# Patient Record
Sex: Male | Born: 1950 | Race: White | Hispanic: No | Marital: Married | Smoking: Never smoker
Health system: Southern US, Community
[De-identification: ages and names within clinical notes are randomized; demographics above are authoritative.]

## PROBLEM LIST (undated history)

## (undated) DIAGNOSIS — N200 Calculus of kidney: Secondary | ICD-10-CM

## (undated) DIAGNOSIS — C169 Malignant neoplasm of stomach, unspecified: Secondary | ICD-10-CM

## (undated) DIAGNOSIS — E119 Type 2 diabetes mellitus without complications: Secondary | ICD-10-CM

## (undated) HISTORY — PX: ABDOMINAL SURGERY: SHX537

## (undated) HISTORY — PX: KNEE SURGERY: SHX244

---

## 2018-01-22 DIAGNOSIS — E785 Hyperlipidemia, unspecified: Secondary | ICD-10-CM | POA: Diagnosis not present

## 2018-01-22 DIAGNOSIS — I10 Essential (primary) hypertension: Secondary | ICD-10-CM | POA: Diagnosis not present

## 2018-01-22 DIAGNOSIS — E669 Obesity, unspecified: Secondary | ICD-10-CM | POA: Diagnosis not present

## 2018-01-22 DIAGNOSIS — R0600 Dyspnea, unspecified: Secondary | ICD-10-CM | POA: Diagnosis not present

## 2018-01-22 DIAGNOSIS — E119 Type 2 diabetes mellitus without complications: Secondary | ICD-10-CM | POA: Diagnosis not present

## 2018-01-22 DIAGNOSIS — C8590 Non-Hodgkin lymphoma, unspecified, unspecified site: Secondary | ICD-10-CM | POA: Diagnosis not present

## 2018-01-22 DIAGNOSIS — Z6833 Body mass index (BMI) 33.0-33.9, adult: Secondary | ICD-10-CM | POA: Diagnosis not present

## 2018-05-06 DIAGNOSIS — Z6834 Body mass index (BMI) 34.0-34.9, adult: Secondary | ICD-10-CM | POA: Diagnosis not present

## 2018-05-06 DIAGNOSIS — C8590 Non-Hodgkin lymphoma, unspecified, unspecified site: Secondary | ICD-10-CM | POA: Diagnosis not present

## 2018-05-06 DIAGNOSIS — E785 Hyperlipidemia, unspecified: Secondary | ICD-10-CM | POA: Diagnosis not present

## 2018-05-06 DIAGNOSIS — Z9181 History of falling: Secondary | ICD-10-CM | POA: Diagnosis not present

## 2018-05-06 DIAGNOSIS — E119 Type 2 diabetes mellitus without complications: Secondary | ICD-10-CM | POA: Diagnosis not present

## 2018-05-06 DIAGNOSIS — R0600 Dyspnea, unspecified: Secondary | ICD-10-CM | POA: Diagnosis not present

## 2018-05-06 DIAGNOSIS — I10 Essential (primary) hypertension: Secondary | ICD-10-CM | POA: Diagnosis not present

## 2018-05-06 DIAGNOSIS — E669 Obesity, unspecified: Secondary | ICD-10-CM | POA: Diagnosis not present

## 2018-05-21 DIAGNOSIS — E669 Obesity, unspecified: Secondary | ICD-10-CM | POA: Diagnosis not present

## 2018-05-21 DIAGNOSIS — I10 Essential (primary) hypertension: Secondary | ICD-10-CM | POA: Diagnosis not present

## 2018-05-21 DIAGNOSIS — C8590 Non-Hodgkin lymphoma, unspecified, unspecified site: Secondary | ICD-10-CM | POA: Diagnosis not present

## 2018-05-21 DIAGNOSIS — E785 Hyperlipidemia, unspecified: Secondary | ICD-10-CM | POA: Diagnosis not present

## 2018-05-21 DIAGNOSIS — Z1339 Encounter for screening examination for other mental health and behavioral disorders: Secondary | ICD-10-CM | POA: Diagnosis not present

## 2018-05-21 DIAGNOSIS — Z6834 Body mass index (BMI) 34.0-34.9, adult: Secondary | ICD-10-CM | POA: Diagnosis not present

## 2018-05-21 DIAGNOSIS — R0600 Dyspnea, unspecified: Secondary | ICD-10-CM | POA: Diagnosis not present

## 2018-05-21 DIAGNOSIS — E119 Type 2 diabetes mellitus without complications: Secondary | ICD-10-CM | POA: Diagnosis not present

## 2018-06-25 DIAGNOSIS — C8330 Diffuse large B-cell lymphoma, unspecified site: Secondary | ICD-10-CM | POA: Diagnosis not present

## 2018-06-25 DIAGNOSIS — I10 Essential (primary) hypertension: Secondary | ICD-10-CM | POA: Diagnosis not present

## 2018-06-25 DIAGNOSIS — E119 Type 2 diabetes mellitus without complications: Secondary | ICD-10-CM | POA: Diagnosis not present

## 2018-06-25 DIAGNOSIS — N2889 Other specified disorders of kidney and ureter: Secondary | ICD-10-CM | POA: Diagnosis not present

## 2018-07-28 DIAGNOSIS — E113299 Type 2 diabetes mellitus with mild nonproliferative diabetic retinopathy without macular edema, unspecified eye: Secondary | ICD-10-CM | POA: Diagnosis not present

## 2018-07-28 DIAGNOSIS — H25813 Combined forms of age-related cataract, bilateral: Secondary | ICD-10-CM | POA: Diagnosis not present

## 2018-07-28 DIAGNOSIS — H40013 Open angle with borderline findings, low risk, bilateral: Secondary | ICD-10-CM | POA: Diagnosis not present

## 2018-07-29 DIAGNOSIS — Z6834 Body mass index (BMI) 34.0-34.9, adult: Secondary | ICD-10-CM | POA: Diagnosis not present

## 2018-07-29 DIAGNOSIS — E669 Obesity, unspecified: Secondary | ICD-10-CM | POA: Diagnosis not present

## 2018-07-29 DIAGNOSIS — E785 Hyperlipidemia, unspecified: Secondary | ICD-10-CM | POA: Diagnosis not present

## 2018-07-29 DIAGNOSIS — Z0000 Encounter for general adult medical examination without abnormal findings: Secondary | ICD-10-CM | POA: Diagnosis not present

## 2018-07-29 DIAGNOSIS — Z125 Encounter for screening for malignant neoplasm of prostate: Secondary | ICD-10-CM | POA: Diagnosis not present

## 2018-07-29 DIAGNOSIS — Z9181 History of falling: Secondary | ICD-10-CM | POA: Diagnosis not present

## 2018-07-29 DIAGNOSIS — Z1331 Encounter for screening for depression: Secondary | ICD-10-CM | POA: Diagnosis not present

## 2018-07-29 DIAGNOSIS — Z139 Encounter for screening, unspecified: Secondary | ICD-10-CM | POA: Diagnosis not present

## 2018-08-14 ENCOUNTER — Other Ambulatory Visit: Payer: Self-pay

## 2018-08-14 NOTE — Patient Outreach (Signed)
[name]) Care Management  08/14/2018  Brad Spencer 1951-03-25 [id number]   Telephone Screen  Referral Date: 08/14/18 Referral Source: [employer] Concierge-Urgent Referral Reason: " member is on insulin and in the coverage gap" Insurance: [employer]   Outreach attempt # 1 to patient. Spoke with patient and discussed referral with patient. He voices that his son committed suicide in his home and he was the one that found him in January of this year. He states that the three months or so that followed were very difficult times for him and his spouse. As a result, he became depressed and neglected his health. His diabetes became uncontrolled during this time.. As he was not adhering to diet restrictions. Patient states that A1C go up to 8.9. After he recovered from his son's death he got back focused on his personal health. He states that now is DM is very well controlled. He doesn't ned to take as much diabetic med as he was previously taking. Patient voices cbgs ranging in the 100s-115's whenever he checks it throughout the day. He has contacted PCP to discuss diabetes mgmt with him. He voices that he has an appt on Oct. 4 with PCP-[name] at [facility]. Patient voices that since his cbgs have been good he has decided to cut back on the amount of insulin he is taking and advised that MD is aware of this. He reports that he is in the donut hole. However, he is not looking for assistance at this time. He was wanting [facility] to change insulin for him. Advised patient that this was something that he needed to discuss with treating physician. His goal is to talk with MD to see if he can totally stop insulin and/or change insulin to the $25 insulin vial that is available at [facility]. He does not feel like he needs pharmacy and/or med assistance until he speaks with PCP and come up with a med regimen for him. He denies any other RN CM needs or concerns at this time. Screening assessment completed  with patient and no other needs/concerns identified. Advised patient that RN CM would send out [facility] brochure for future reference and to call for any needs or concerns. He voiced understanding.     Plan: RN CM will close case at this time.   [name], RN,BSN,CCM [facility] Telephonic Care Management Coordinator Direct Phone: [phone] Toll Free: [phone] Fax: [phone]

## 2018-09-04 DIAGNOSIS — E785 Hyperlipidemia, unspecified: Secondary | ICD-10-CM | POA: Diagnosis not present

## 2018-09-04 DIAGNOSIS — I10 Essential (primary) hypertension: Secondary | ICD-10-CM | POA: Diagnosis not present

## 2018-09-04 DIAGNOSIS — E118 Type 2 diabetes mellitus with unspecified complications: Secondary | ICD-10-CM | POA: Diagnosis not present

## 2018-09-04 DIAGNOSIS — C8590 Non-Hodgkin lymphoma, unspecified, unspecified site: Secondary | ICD-10-CM | POA: Diagnosis not present

## 2018-09-04 DIAGNOSIS — R0600 Dyspnea, unspecified: Secondary | ICD-10-CM | POA: Diagnosis not present

## 2018-09-04 DIAGNOSIS — Z23 Encounter for immunization: Secondary | ICD-10-CM | POA: Diagnosis not present

## 2018-09-04 DIAGNOSIS — E669 Obesity, unspecified: Secondary | ICD-10-CM | POA: Diagnosis not present

## 2018-09-04 DIAGNOSIS — Z6834 Body mass index (BMI) 34.0-34.9, adult: Secondary | ICD-10-CM | POA: Diagnosis not present

## 2018-10-27 DIAGNOSIS — L570 Actinic keratosis: Secondary | ICD-10-CM | POA: Diagnosis not present

## 2018-10-27 DIAGNOSIS — C44319 Basal cell carcinoma of skin of other parts of face: Secondary | ICD-10-CM | POA: Diagnosis not present

## 2018-12-29 DIAGNOSIS — I10 Essential (primary) hypertension: Secondary | ICD-10-CM | POA: Diagnosis not present

## 2018-12-29 DIAGNOSIS — E669 Obesity, unspecified: Secondary | ICD-10-CM | POA: Diagnosis not present

## 2018-12-29 DIAGNOSIS — Z6834 Body mass index (BMI) 34.0-34.9, adult: Secondary | ICD-10-CM | POA: Diagnosis not present

## 2018-12-29 DIAGNOSIS — E118 Type 2 diabetes mellitus with unspecified complications: Secondary | ICD-10-CM | POA: Diagnosis not present

## 2018-12-29 DIAGNOSIS — E785 Hyperlipidemia, unspecified: Secondary | ICD-10-CM | POA: Diagnosis not present

## 2018-12-29 DIAGNOSIS — C8590 Non-Hodgkin lymphoma, unspecified, unspecified site: Secondary | ICD-10-CM | POA: Diagnosis not present

## 2018-12-29 DIAGNOSIS — R0600 Dyspnea, unspecified: Secondary | ICD-10-CM | POA: Diagnosis not present

## 2019-01-26 DIAGNOSIS — I10 Essential (primary) hypertension: Secondary | ICD-10-CM | POA: Diagnosis not present

## 2019-01-26 DIAGNOSIS — C8590 Non-Hodgkin lymphoma, unspecified, unspecified site: Secondary | ICD-10-CM | POA: Diagnosis not present

## 2019-01-26 DIAGNOSIS — R0600 Dyspnea, unspecified: Secondary | ICD-10-CM | POA: Diagnosis not present

## 2019-01-26 DIAGNOSIS — E118 Type 2 diabetes mellitus with unspecified complications: Secondary | ICD-10-CM | POA: Diagnosis not present

## 2019-01-26 DIAGNOSIS — E669 Obesity, unspecified: Secondary | ICD-10-CM | POA: Diagnosis not present

## 2019-01-26 DIAGNOSIS — E785 Hyperlipidemia, unspecified: Secondary | ICD-10-CM | POA: Diagnosis not present

## 2019-01-26 DIAGNOSIS — Z6834 Body mass index (BMI) 34.0-34.9, adult: Secondary | ICD-10-CM | POA: Diagnosis not present

## 2019-03-01 DIAGNOSIS — E785 Hyperlipidemia, unspecified: Secondary | ICD-10-CM | POA: Diagnosis not present

## 2019-03-01 DIAGNOSIS — C8590 Non-Hodgkin lymphoma, unspecified, unspecified site: Secondary | ICD-10-CM | POA: Diagnosis not present

## 2019-03-01 DIAGNOSIS — R0600 Dyspnea, unspecified: Secondary | ICD-10-CM | POA: Diagnosis not present

## 2019-03-01 DIAGNOSIS — E669 Obesity, unspecified: Secondary | ICD-10-CM | POA: Diagnosis not present

## 2019-03-01 DIAGNOSIS — I10 Essential (primary) hypertension: Secondary | ICD-10-CM | POA: Diagnosis not present

## 2019-03-01 DIAGNOSIS — Z6834 Body mass index (BMI) 34.0-34.9, adult: Secondary | ICD-10-CM | POA: Diagnosis not present

## 2019-03-01 DIAGNOSIS — E118 Type 2 diabetes mellitus with unspecified complications: Secondary | ICD-10-CM | POA: Diagnosis not present

## 2019-04-02 DIAGNOSIS — I10 Essential (primary) hypertension: Secondary | ICD-10-CM | POA: Diagnosis not present

## 2019-04-02 DIAGNOSIS — R0600 Dyspnea, unspecified: Secondary | ICD-10-CM | POA: Diagnosis not present

## 2019-04-02 DIAGNOSIS — E118 Type 2 diabetes mellitus with unspecified complications: Secondary | ICD-10-CM | POA: Diagnosis not present

## 2019-04-02 DIAGNOSIS — E669 Obesity, unspecified: Secondary | ICD-10-CM | POA: Diagnosis not present

## 2019-04-02 DIAGNOSIS — E785 Hyperlipidemia, unspecified: Secondary | ICD-10-CM | POA: Diagnosis not present

## 2019-04-02 DIAGNOSIS — C8590 Non-Hodgkin lymphoma, unspecified, unspecified site: Secondary | ICD-10-CM | POA: Diagnosis not present

## 2019-04-02 DIAGNOSIS — Z6834 Body mass index (BMI) 34.0-34.9, adult: Secondary | ICD-10-CM | POA: Diagnosis not present

## 2019-04-16 DIAGNOSIS — I10 Essential (primary) hypertension: Secondary | ICD-10-CM | POA: Diagnosis not present

## 2019-04-16 DIAGNOSIS — C8590 Non-Hodgkin lymphoma, unspecified, unspecified site: Secondary | ICD-10-CM | POA: Diagnosis not present

## 2019-04-16 DIAGNOSIS — Z6834 Body mass index (BMI) 34.0-34.9, adult: Secondary | ICD-10-CM | POA: Diagnosis not present

## 2019-04-16 DIAGNOSIS — R0600 Dyspnea, unspecified: Secondary | ICD-10-CM | POA: Diagnosis not present

## 2019-04-16 DIAGNOSIS — K5909 Other constipation: Secondary | ICD-10-CM | POA: Diagnosis not present

## 2019-04-16 DIAGNOSIS — E118 Type 2 diabetes mellitus with unspecified complications: Secondary | ICD-10-CM | POA: Diagnosis not present

## 2019-04-16 DIAGNOSIS — E785 Hyperlipidemia, unspecified: Secondary | ICD-10-CM | POA: Diagnosis not present

## 2019-04-16 DIAGNOSIS — E669 Obesity, unspecified: Secondary | ICD-10-CM | POA: Diagnosis not present

## 2019-05-07 DIAGNOSIS — E118 Type 2 diabetes mellitus with unspecified complications: Secondary | ICD-10-CM | POA: Diagnosis not present

## 2019-05-07 DIAGNOSIS — R0600 Dyspnea, unspecified: Secondary | ICD-10-CM | POA: Diagnosis not present

## 2019-05-07 DIAGNOSIS — K5909 Other constipation: Secondary | ICD-10-CM | POA: Diagnosis not present

## 2019-05-07 DIAGNOSIS — Z6835 Body mass index (BMI) 35.0-35.9, adult: Secondary | ICD-10-CM | POA: Diagnosis not present

## 2019-05-07 DIAGNOSIS — C8590 Non-Hodgkin lymphoma, unspecified, unspecified site: Secondary | ICD-10-CM | POA: Diagnosis not present

## 2019-05-07 DIAGNOSIS — E785 Hyperlipidemia, unspecified: Secondary | ICD-10-CM | POA: Diagnosis not present

## 2019-05-07 DIAGNOSIS — I10 Essential (primary) hypertension: Secondary | ICD-10-CM | POA: Diagnosis not present

## 2019-05-07 DIAGNOSIS — E669 Obesity, unspecified: Secondary | ICD-10-CM | POA: Diagnosis not present

## 2019-06-19 DIAGNOSIS — S93402A Sprain of unspecified ligament of left ankle, initial encounter: Secondary | ICD-10-CM | POA: Diagnosis not present

## 2019-07-08 DIAGNOSIS — E669 Obesity, unspecified: Secondary | ICD-10-CM | POA: Diagnosis not present

## 2019-07-08 DIAGNOSIS — I10 Essential (primary) hypertension: Secondary | ICD-10-CM | POA: Diagnosis not present

## 2019-07-08 DIAGNOSIS — C8590 Non-Hodgkin lymphoma, unspecified, unspecified site: Secondary | ICD-10-CM | POA: Diagnosis not present

## 2019-07-08 DIAGNOSIS — E785 Hyperlipidemia, unspecified: Secondary | ICD-10-CM | POA: Diagnosis not present

## 2019-07-08 DIAGNOSIS — K5909 Other constipation: Secondary | ICD-10-CM | POA: Diagnosis not present

## 2019-07-08 DIAGNOSIS — M159 Polyosteoarthritis, unspecified: Secondary | ICD-10-CM | POA: Diagnosis not present

## 2019-07-08 DIAGNOSIS — Z6834 Body mass index (BMI) 34.0-34.9, adult: Secondary | ICD-10-CM | POA: Diagnosis not present

## 2019-07-08 DIAGNOSIS — R0600 Dyspnea, unspecified: Secondary | ICD-10-CM | POA: Diagnosis not present

## 2019-07-08 DIAGNOSIS — E118 Type 2 diabetes mellitus with unspecified complications: Secondary | ICD-10-CM | POA: Diagnosis not present

## 2019-07-20 DIAGNOSIS — E119 Type 2 diabetes mellitus without complications: Secondary | ICD-10-CM | POA: Diagnosis not present

## 2019-07-20 DIAGNOSIS — R5383 Other fatigue: Secondary | ICD-10-CM | POA: Diagnosis not present

## 2019-07-20 DIAGNOSIS — N2889 Other specified disorders of kidney and ureter: Secondary | ICD-10-CM | POA: Diagnosis not present

## 2019-07-20 DIAGNOSIS — C8330 Diffuse large B-cell lymphoma, unspecified site: Secondary | ICD-10-CM | POA: Diagnosis not present

## 2019-07-20 DIAGNOSIS — I10 Essential (primary) hypertension: Secondary | ICD-10-CM | POA: Diagnosis not present

## 2019-08-04 DIAGNOSIS — Z1331 Encounter for screening for depression: Secondary | ICD-10-CM | POA: Diagnosis not present

## 2019-08-04 DIAGNOSIS — Z0000 Encounter for general adult medical examination without abnormal findings: Secondary | ICD-10-CM | POA: Diagnosis not present

## 2019-08-04 DIAGNOSIS — Z139 Encounter for screening, unspecified: Secondary | ICD-10-CM | POA: Diagnosis not present

## 2019-08-04 DIAGNOSIS — Z125 Encounter for screening for malignant neoplasm of prostate: Secondary | ICD-10-CM | POA: Diagnosis not present

## 2019-08-04 DIAGNOSIS — Z9181 History of falling: Secondary | ICD-10-CM | POA: Diagnosis not present

## 2019-08-04 DIAGNOSIS — Z6834 Body mass index (BMI) 34.0-34.9, adult: Secondary | ICD-10-CM | POA: Diagnosis not present

## 2019-08-04 DIAGNOSIS — E785 Hyperlipidemia, unspecified: Secondary | ICD-10-CM | POA: Diagnosis not present

## 2019-08-20 DIAGNOSIS — D126 Benign neoplasm of colon, unspecified: Secondary | ICD-10-CM | POA: Diagnosis not present

## 2019-08-20 DIAGNOSIS — K635 Polyp of colon: Secondary | ICD-10-CM | POA: Diagnosis not present

## 2019-08-20 DIAGNOSIS — Z1211 Encounter for screening for malignant neoplasm of colon: Secondary | ICD-10-CM | POA: Diagnosis not present

## 2019-09-28 DIAGNOSIS — E785 Hyperlipidemia, unspecified: Secondary | ICD-10-CM | POA: Diagnosis not present

## 2019-09-28 DIAGNOSIS — R0600 Dyspnea, unspecified: Secondary | ICD-10-CM | POA: Diagnosis not present

## 2019-09-28 DIAGNOSIS — Z6834 Body mass index (BMI) 34.0-34.9, adult: Secondary | ICD-10-CM | POA: Diagnosis not present

## 2019-09-28 DIAGNOSIS — I10 Essential (primary) hypertension: Secondary | ICD-10-CM | POA: Diagnosis not present

## 2019-09-28 DIAGNOSIS — E669 Obesity, unspecified: Secondary | ICD-10-CM | POA: Diagnosis not present

## 2019-09-28 DIAGNOSIS — M159 Polyosteoarthritis, unspecified: Secondary | ICD-10-CM | POA: Diagnosis not present

## 2019-09-28 DIAGNOSIS — C8590 Non-Hodgkin lymphoma, unspecified, unspecified site: Secondary | ICD-10-CM | POA: Diagnosis not present

## 2019-09-28 DIAGNOSIS — K5909 Other constipation: Secondary | ICD-10-CM | POA: Diagnosis not present

## 2019-10-12 DIAGNOSIS — K5909 Other constipation: Secondary | ICD-10-CM | POA: Diagnosis not present

## 2019-10-12 DIAGNOSIS — Z6834 Body mass index (BMI) 34.0-34.9, adult: Secondary | ICD-10-CM | POA: Diagnosis not present

## 2019-10-12 DIAGNOSIS — M159 Polyosteoarthritis, unspecified: Secondary | ICD-10-CM | POA: Diagnosis not present

## 2019-10-12 DIAGNOSIS — R0600 Dyspnea, unspecified: Secondary | ICD-10-CM | POA: Diagnosis not present

## 2019-10-12 DIAGNOSIS — Z23 Encounter for immunization: Secondary | ICD-10-CM | POA: Diagnosis not present

## 2019-10-12 DIAGNOSIS — M25512 Pain in left shoulder: Secondary | ICD-10-CM | POA: Diagnosis not present

## 2019-10-12 DIAGNOSIS — C8590 Non-Hodgkin lymphoma, unspecified, unspecified site: Secondary | ICD-10-CM | POA: Diagnosis not present

## 2019-10-12 DIAGNOSIS — E785 Hyperlipidemia, unspecified: Secondary | ICD-10-CM | POA: Diagnosis not present

## 2019-10-12 DIAGNOSIS — I10 Essential (primary) hypertension: Secondary | ICD-10-CM | POA: Diagnosis not present

## 2019-10-12 DIAGNOSIS — E118 Type 2 diabetes mellitus with unspecified complications: Secondary | ICD-10-CM | POA: Diagnosis not present

## 2019-10-12 DIAGNOSIS — E669 Obesity, unspecified: Secondary | ICD-10-CM | POA: Diagnosis not present

## 2019-10-26 DIAGNOSIS — E118 Type 2 diabetes mellitus with unspecified complications: Secondary | ICD-10-CM | POA: Diagnosis not present

## 2019-10-26 DIAGNOSIS — C8590 Non-Hodgkin lymphoma, unspecified, unspecified site: Secondary | ICD-10-CM | POA: Diagnosis not present

## 2019-10-26 DIAGNOSIS — E785 Hyperlipidemia, unspecified: Secondary | ICD-10-CM | POA: Diagnosis not present

## 2019-10-26 DIAGNOSIS — K5909 Other constipation: Secondary | ICD-10-CM | POA: Diagnosis not present

## 2019-10-26 DIAGNOSIS — Z6834 Body mass index (BMI) 34.0-34.9, adult: Secondary | ICD-10-CM | POA: Diagnosis not present

## 2019-10-26 DIAGNOSIS — I10 Essential (primary) hypertension: Secondary | ICD-10-CM | POA: Diagnosis not present

## 2019-10-26 DIAGNOSIS — E669 Obesity, unspecified: Secondary | ICD-10-CM | POA: Diagnosis not present

## 2019-10-26 DIAGNOSIS — M159 Polyosteoarthritis, unspecified: Secondary | ICD-10-CM | POA: Diagnosis not present

## 2019-10-26 DIAGNOSIS — R0600 Dyspnea, unspecified: Secondary | ICD-10-CM | POA: Diagnosis not present

## 2019-11-02 DIAGNOSIS — L570 Actinic keratosis: Secondary | ICD-10-CM | POA: Diagnosis not present

## 2019-11-23 DIAGNOSIS — E785 Hyperlipidemia, unspecified: Secondary | ICD-10-CM | POA: Diagnosis not present

## 2019-11-23 DIAGNOSIS — K5909 Other constipation: Secondary | ICD-10-CM | POA: Diagnosis not present

## 2019-11-23 DIAGNOSIS — E118 Type 2 diabetes mellitus with unspecified complications: Secondary | ICD-10-CM | POA: Diagnosis not present

## 2019-11-23 DIAGNOSIS — E669 Obesity, unspecified: Secondary | ICD-10-CM | POA: Diagnosis not present

## 2019-11-23 DIAGNOSIS — Z6834 Body mass index (BMI) 34.0-34.9, adult: Secondary | ICD-10-CM | POA: Diagnosis not present

## 2019-11-23 DIAGNOSIS — C8590 Non-Hodgkin lymphoma, unspecified, unspecified site: Secondary | ICD-10-CM | POA: Diagnosis not present

## 2019-11-23 DIAGNOSIS — M159 Polyosteoarthritis, unspecified: Secondary | ICD-10-CM | POA: Diagnosis not present

## 2019-11-23 DIAGNOSIS — I10 Essential (primary) hypertension: Secondary | ICD-10-CM | POA: Diagnosis not present

## 2019-11-23 DIAGNOSIS — R0600 Dyspnea, unspecified: Secondary | ICD-10-CM | POA: Diagnosis not present

## 2019-12-14 DIAGNOSIS — J208 Acute bronchitis due to other specified organisms: Secondary | ICD-10-CM | POA: Diagnosis not present

## 2019-12-14 DIAGNOSIS — K5909 Other constipation: Secondary | ICD-10-CM | POA: Diagnosis not present

## 2019-12-14 DIAGNOSIS — E118 Type 2 diabetes mellitus with unspecified complications: Secondary | ICD-10-CM | POA: Diagnosis not present

## 2019-12-14 DIAGNOSIS — Z6834 Body mass index (BMI) 34.0-34.9, adult: Secondary | ICD-10-CM | POA: Diagnosis not present

## 2019-12-14 DIAGNOSIS — M159 Polyosteoarthritis, unspecified: Secondary | ICD-10-CM | POA: Diagnosis not present

## 2019-12-14 DIAGNOSIS — C8590 Non-Hodgkin lymphoma, unspecified, unspecified site: Secondary | ICD-10-CM | POA: Diagnosis not present

## 2019-12-14 DIAGNOSIS — I10 Essential (primary) hypertension: Secondary | ICD-10-CM | POA: Diagnosis not present

## 2019-12-14 DIAGNOSIS — B9689 Other specified bacterial agents as the cause of diseases classified elsewhere: Secondary | ICD-10-CM | POA: Diagnosis not present

## 2019-12-14 DIAGNOSIS — E785 Hyperlipidemia, unspecified: Secondary | ICD-10-CM | POA: Diagnosis not present

## 2019-12-14 DIAGNOSIS — R0600 Dyspnea, unspecified: Secondary | ICD-10-CM | POA: Diagnosis not present

## 2019-12-14 DIAGNOSIS — E669 Obesity, unspecified: Secondary | ICD-10-CM | POA: Diagnosis not present

## 2019-12-15 DIAGNOSIS — E785 Hyperlipidemia, unspecified: Secondary | ICD-10-CM | POA: Diagnosis not present

## 2019-12-15 DIAGNOSIS — E118 Type 2 diabetes mellitus with unspecified complications: Secondary | ICD-10-CM | POA: Diagnosis not present

## 2019-12-15 DIAGNOSIS — R0600 Dyspnea, unspecified: Secondary | ICD-10-CM | POA: Diagnosis not present

## 2019-12-15 DIAGNOSIS — Z20828 Contact with and (suspected) exposure to other viral communicable diseases: Secondary | ICD-10-CM | POA: Diagnosis not present

## 2019-12-15 DIAGNOSIS — R6889 Other general symptoms and signs: Secondary | ICD-10-CM | POA: Diagnosis not present

## 2019-12-15 DIAGNOSIS — Z20822 Contact with and (suspected) exposure to covid-19: Secondary | ICD-10-CM | POA: Diagnosis not present

## 2019-12-15 DIAGNOSIS — I10 Essential (primary) hypertension: Secondary | ICD-10-CM | POA: Diagnosis not present

## 2019-12-15 DIAGNOSIS — J208 Acute bronchitis due to other specified organisms: Secondary | ICD-10-CM | POA: Diagnosis not present

## 2019-12-15 DIAGNOSIS — M159 Polyosteoarthritis, unspecified: Secondary | ICD-10-CM | POA: Diagnosis not present

## 2019-12-15 DIAGNOSIS — K5909 Other constipation: Secondary | ICD-10-CM | POA: Diagnosis not present

## 2019-12-15 DIAGNOSIS — C8590 Non-Hodgkin lymphoma, unspecified, unspecified site: Secondary | ICD-10-CM | POA: Diagnosis not present

## 2019-12-15 DIAGNOSIS — E669 Obesity, unspecified: Secondary | ICD-10-CM | POA: Diagnosis not present

## 2019-12-15 DIAGNOSIS — B9689 Other specified bacterial agents as the cause of diseases classified elsewhere: Secondary | ICD-10-CM | POA: Diagnosis not present

## 2019-12-17 DIAGNOSIS — C8590 Non-Hodgkin lymphoma, unspecified, unspecified site: Secondary | ICD-10-CM | POA: Diagnosis not present

## 2019-12-17 DIAGNOSIS — Z6834 Body mass index (BMI) 34.0-34.9, adult: Secondary | ICD-10-CM | POA: Diagnosis not present

## 2019-12-17 DIAGNOSIS — E785 Hyperlipidemia, unspecified: Secondary | ICD-10-CM | POA: Diagnosis not present

## 2019-12-17 DIAGNOSIS — I10 Essential (primary) hypertension: Secondary | ICD-10-CM | POA: Diagnosis not present

## 2019-12-17 DIAGNOSIS — M159 Polyosteoarthritis, unspecified: Secondary | ICD-10-CM | POA: Diagnosis not present

## 2019-12-17 DIAGNOSIS — J208 Acute bronchitis due to other specified organisms: Secondary | ICD-10-CM | POA: Diagnosis not present

## 2019-12-17 DIAGNOSIS — R0600 Dyspnea, unspecified: Secondary | ICD-10-CM | POA: Diagnosis not present

## 2019-12-17 DIAGNOSIS — E669 Obesity, unspecified: Secondary | ICD-10-CM | POA: Diagnosis not present

## 2019-12-17 DIAGNOSIS — E118 Type 2 diabetes mellitus with unspecified complications: Secondary | ICD-10-CM | POA: Diagnosis not present

## 2019-12-17 DIAGNOSIS — U071 COVID-19: Secondary | ICD-10-CM | POA: Diagnosis not present

## 2019-12-17 DIAGNOSIS — K5909 Other constipation: Secondary | ICD-10-CM | POA: Diagnosis not present

## 2019-12-24 DIAGNOSIS — E669 Obesity, unspecified: Secondary | ICD-10-CM | POA: Diagnosis not present

## 2019-12-24 DIAGNOSIS — U071 COVID-19: Secondary | ICD-10-CM | POA: Diagnosis not present

## 2019-12-24 DIAGNOSIS — K5909 Other constipation: Secondary | ICD-10-CM | POA: Diagnosis not present

## 2019-12-24 DIAGNOSIS — Z6834 Body mass index (BMI) 34.0-34.9, adult: Secondary | ICD-10-CM | POA: Diagnosis not present

## 2019-12-24 DIAGNOSIS — E785 Hyperlipidemia, unspecified: Secondary | ICD-10-CM | POA: Diagnosis not present

## 2019-12-24 DIAGNOSIS — I10 Essential (primary) hypertension: Secondary | ICD-10-CM | POA: Diagnosis not present

## 2019-12-24 DIAGNOSIS — E118 Type 2 diabetes mellitus with unspecified complications: Secondary | ICD-10-CM | POA: Diagnosis not present

## 2019-12-24 DIAGNOSIS — C8590 Non-Hodgkin lymphoma, unspecified, unspecified site: Secondary | ICD-10-CM | POA: Diagnosis not present

## 2019-12-24 DIAGNOSIS — M159 Polyosteoarthritis, unspecified: Secondary | ICD-10-CM | POA: Diagnosis not present

## 2019-12-24 DIAGNOSIS — J208 Acute bronchitis due to other specified organisms: Secondary | ICD-10-CM | POA: Diagnosis not present

## 2019-12-24 DIAGNOSIS — R0600 Dyspnea, unspecified: Secondary | ICD-10-CM | POA: Diagnosis not present

## 2020-01-07 DIAGNOSIS — E118 Type 2 diabetes mellitus with unspecified complications: Secondary | ICD-10-CM | POA: Diagnosis not present

## 2020-01-07 DIAGNOSIS — E669 Obesity, unspecified: Secondary | ICD-10-CM | POA: Diagnosis not present

## 2020-01-07 DIAGNOSIS — C8590 Non-Hodgkin lymphoma, unspecified, unspecified site: Secondary | ICD-10-CM | POA: Diagnosis not present

## 2020-01-07 DIAGNOSIS — K5909 Other constipation: Secondary | ICD-10-CM | POA: Diagnosis not present

## 2020-01-07 DIAGNOSIS — R0600 Dyspnea, unspecified: Secondary | ICD-10-CM | POA: Diagnosis not present

## 2020-01-07 DIAGNOSIS — I10 Essential (primary) hypertension: Secondary | ICD-10-CM | POA: Diagnosis not present

## 2020-01-07 DIAGNOSIS — U071 COVID-19: Secondary | ICD-10-CM | POA: Diagnosis not present

## 2020-01-07 DIAGNOSIS — M159 Polyosteoarthritis, unspecified: Secondary | ICD-10-CM | POA: Diagnosis not present

## 2020-01-07 DIAGNOSIS — Z6834 Body mass index (BMI) 34.0-34.9, adult: Secondary | ICD-10-CM | POA: Diagnosis not present

## 2020-01-07 DIAGNOSIS — E785 Hyperlipidemia, unspecified: Secondary | ICD-10-CM | POA: Diagnosis not present

## 2020-02-04 DIAGNOSIS — M159 Polyosteoarthritis, unspecified: Secondary | ICD-10-CM | POA: Diagnosis not present

## 2020-02-04 DIAGNOSIS — E785 Hyperlipidemia, unspecified: Secondary | ICD-10-CM | POA: Diagnosis not present

## 2020-02-04 DIAGNOSIS — R0600 Dyspnea, unspecified: Secondary | ICD-10-CM | POA: Diagnosis not present

## 2020-02-04 DIAGNOSIS — E669 Obesity, unspecified: Secondary | ICD-10-CM | POA: Diagnosis not present

## 2020-02-04 DIAGNOSIS — C8590 Non-Hodgkin lymphoma, unspecified, unspecified site: Secondary | ICD-10-CM | POA: Diagnosis not present

## 2020-02-04 DIAGNOSIS — I10 Essential (primary) hypertension: Secondary | ICD-10-CM | POA: Diagnosis not present

## 2020-02-04 DIAGNOSIS — Z6834 Body mass index (BMI) 34.0-34.9, adult: Secondary | ICD-10-CM | POA: Diagnosis not present

## 2020-02-04 DIAGNOSIS — E118 Type 2 diabetes mellitus with unspecified complications: Secondary | ICD-10-CM | POA: Diagnosis not present

## 2020-02-04 DIAGNOSIS — K5909 Other constipation: Secondary | ICD-10-CM | POA: Diagnosis not present

## 2020-03-02 DIAGNOSIS — K5909 Other constipation: Secondary | ICD-10-CM | POA: Diagnosis not present

## 2020-03-02 DIAGNOSIS — C8590 Non-Hodgkin lymphoma, unspecified, unspecified site: Secondary | ICD-10-CM | POA: Diagnosis not present

## 2020-03-02 DIAGNOSIS — Z6833 Body mass index (BMI) 33.0-33.9, adult: Secondary | ICD-10-CM | POA: Diagnosis not present

## 2020-03-02 DIAGNOSIS — E669 Obesity, unspecified: Secondary | ICD-10-CM | POA: Diagnosis not present

## 2020-03-02 DIAGNOSIS — L309 Dermatitis, unspecified: Secondary | ICD-10-CM | POA: Diagnosis not present

## 2020-03-02 DIAGNOSIS — E118 Type 2 diabetes mellitus with unspecified complications: Secondary | ICD-10-CM | POA: Diagnosis not present

## 2020-03-02 DIAGNOSIS — M159 Polyosteoarthritis, unspecified: Secondary | ICD-10-CM | POA: Diagnosis not present

## 2020-03-02 DIAGNOSIS — E785 Hyperlipidemia, unspecified: Secondary | ICD-10-CM | POA: Diagnosis not present

## 2020-03-02 DIAGNOSIS — I10 Essential (primary) hypertension: Secondary | ICD-10-CM | POA: Diagnosis not present

## 2020-03-02 DIAGNOSIS — R0600 Dyspnea, unspecified: Secondary | ICD-10-CM | POA: Diagnosis not present

## 2020-03-09 DIAGNOSIS — E785 Hyperlipidemia, unspecified: Secondary | ICD-10-CM | POA: Diagnosis not present

## 2020-03-09 DIAGNOSIS — M159 Polyosteoarthritis, unspecified: Secondary | ICD-10-CM | POA: Diagnosis not present

## 2020-03-09 DIAGNOSIS — I10 Essential (primary) hypertension: Secondary | ICD-10-CM | POA: Diagnosis not present

## 2020-03-09 DIAGNOSIS — C8590 Non-Hodgkin lymphoma, unspecified, unspecified site: Secondary | ICD-10-CM | POA: Diagnosis not present

## 2020-03-09 DIAGNOSIS — Z6833 Body mass index (BMI) 33.0-33.9, adult: Secondary | ICD-10-CM | POA: Diagnosis not present

## 2020-03-09 DIAGNOSIS — E118 Type 2 diabetes mellitus with unspecified complications: Secondary | ICD-10-CM | POA: Diagnosis not present

## 2020-03-09 DIAGNOSIS — K5909 Other constipation: Secondary | ICD-10-CM | POA: Diagnosis not present

## 2020-03-09 DIAGNOSIS — E669 Obesity, unspecified: Secondary | ICD-10-CM | POA: Diagnosis not present

## 2020-03-09 DIAGNOSIS — R0600 Dyspnea, unspecified: Secondary | ICD-10-CM | POA: Diagnosis not present

## 2020-05-22 ENCOUNTER — Observation Stay (HOSPITAL_COMMUNITY): Payer: PPO

## 2020-05-22 ENCOUNTER — Inpatient Hospital Stay (HOSPITAL_COMMUNITY)
Admission: EM | Admit: 2020-05-22 | Discharge: 2020-05-25 | DRG: 948 | Disposition: A | Discharge status: Home / Self Care | Payer: PPO | Attending: Family Medicine | Admitting: Family Medicine

## 2020-05-22 ENCOUNTER — Other Ambulatory Visit: Payer: Self-pay

## 2020-05-22 ENCOUNTER — Emergency Department (HOSPITAL_COMMUNITY): Payer: PPO

## 2020-05-22 ENCOUNTER — Encounter (HOSPITAL_COMMUNITY): Payer: Self-pay | Admitting: Emergency Medicine

## 2020-05-22 DIAGNOSIS — Z85028 Personal history of other malignant neoplasm of stomach: Secondary | ICD-10-CM | POA: Diagnosis not present

## 2020-05-22 DIAGNOSIS — X500XXA Overexertion from strenuous movement or load, initial encounter: Secondary | ICD-10-CM

## 2020-05-22 DIAGNOSIS — Y92009 Unspecified place in unspecified non-institutional (private) residence as the place of occurrence of the external cause: Secondary | ICD-10-CM | POA: Diagnosis not present

## 2020-05-22 DIAGNOSIS — Z91040 Latex allergy status: Secondary | ICD-10-CM | POA: Diagnosis not present

## 2020-05-22 DIAGNOSIS — Z888 Allergy status to other drugs, medicaments and biological substances status: Secondary | ICD-10-CM | POA: Diagnosis not present

## 2020-05-22 DIAGNOSIS — E1165 Type 2 diabetes mellitus with hyperglycemia: Secondary | ICD-10-CM | POA: Diagnosis not present

## 2020-05-22 DIAGNOSIS — R52 Pain, unspecified: Secondary | ICD-10-CM | POA: Diagnosis not present

## 2020-05-22 DIAGNOSIS — G8911 Acute pain due to trauma: Principal | ICD-10-CM | POA: Diagnosis present

## 2020-05-22 DIAGNOSIS — Z20822 Contact with and (suspected) exposure to covid-19: Secondary | ICD-10-CM | POA: Diagnosis present

## 2020-05-22 DIAGNOSIS — W19XXXA Unspecified fall, initial encounter: Secondary | ICD-10-CM

## 2020-05-22 DIAGNOSIS — E119 Type 2 diabetes mellitus without complications: Secondary | ICD-10-CM | POA: Diagnosis not present

## 2020-05-22 DIAGNOSIS — M009 Pyogenic arthritis, unspecified: Secondary | ICD-10-CM | POA: Diagnosis present

## 2020-05-22 DIAGNOSIS — I6521 Occlusion and stenosis of right carotid artery: Secondary | ICD-10-CM | POA: Diagnosis present

## 2020-05-22 DIAGNOSIS — Y9389 Activity, other specified: Secondary | ICD-10-CM

## 2020-05-22 DIAGNOSIS — R42 Dizziness and giddiness: Secondary | ICD-10-CM | POA: Diagnosis not present

## 2020-05-22 DIAGNOSIS — M25461 Effusion, right knee: Secondary | ICD-10-CM | POA: Diagnosis present

## 2020-05-22 DIAGNOSIS — Z79899 Other long term (current) drug therapy: Secondary | ICD-10-CM

## 2020-05-22 DIAGNOSIS — M542 Cervicalgia: Secondary | ICD-10-CM | POA: Diagnosis present

## 2020-05-22 DIAGNOSIS — M25561 Pain in right knee: Secondary | ICD-10-CM | POA: Diagnosis not present

## 2020-05-22 DIAGNOSIS — R197 Diarrhea, unspecified: Secondary | ICD-10-CM | POA: Diagnosis present

## 2020-05-22 DIAGNOSIS — Z794 Long term (current) use of insulin: Secondary | ICD-10-CM

## 2020-05-22 DIAGNOSIS — J90 Pleural effusion, not elsewhere classified: Secondary | ICD-10-CM | POA: Diagnosis not present

## 2020-05-22 DIAGNOSIS — J9811 Atelectasis: Secondary | ICD-10-CM | POA: Diagnosis not present

## 2020-05-22 DIAGNOSIS — R569 Unspecified convulsions: Secondary | ICD-10-CM | POA: Diagnosis not present

## 2020-05-22 DIAGNOSIS — R519 Headache, unspecified: Secondary | ICD-10-CM | POA: Diagnosis present

## 2020-05-22 DIAGNOSIS — R55 Syncope and collapse: Secondary | ICD-10-CM | POA: Diagnosis present

## 2020-05-22 DIAGNOSIS — R001 Bradycardia, unspecified: Secondary | ICD-10-CM | POA: Diagnosis present

## 2020-05-22 DIAGNOSIS — Z8572 Personal history of non-Hodgkin lymphomas: Secondary | ICD-10-CM

## 2020-05-22 DIAGNOSIS — S83241A Other tear of medial meniscus, current injury, right knee, initial encounter: Secondary | ICD-10-CM | POA: Diagnosis present

## 2020-05-22 DIAGNOSIS — R0602 Shortness of breath: Secondary | ICD-10-CM | POA: Diagnosis not present

## 2020-05-22 DIAGNOSIS — Y929 Unspecified place or not applicable: Secondary | ICD-10-CM

## 2020-05-22 DIAGNOSIS — E875 Hyperkalemia: Secondary | ICD-10-CM | POA: Diagnosis present

## 2020-05-22 DIAGNOSIS — R0902 Hypoxemia: Secondary | ICD-10-CM | POA: Diagnosis not present

## 2020-05-22 DIAGNOSIS — I10 Essential (primary) hypertension: Secondary | ICD-10-CM | POA: Diagnosis present

## 2020-05-22 HISTORY — DX: Type 2 diabetes mellitus without complications: E11.9

## 2020-05-22 HISTORY — DX: Calculus of kidney: N20.0

## 2020-05-22 HISTORY — DX: Malignant neoplasm of stomach, unspecified: C16.9

## 2020-05-22 LAB — CBC WITH DIFFERENTIAL/PLATELET
Abs Immature Granulocytes: 0.05 10*3/uL (ref 0.00–0.07)
Basophils Absolute: 0 10*3/uL (ref 0.0–0.1)
Basophils Relative: 0 %
Eosinophils Absolute: 0 10*3/uL (ref 0.0–0.5)
Eosinophils Relative: 0 %
HCT: 45 % (ref 39.0–52.0)
Hemoglobin: 14.7 g/dL (ref 13.0–17.0)
Immature Granulocytes: 0 %
Lymphocytes Relative: 4 %
Lymphs Abs: 0.5 10*3/uL — ABNORMAL LOW (ref 0.7–4.0)
MCH: 32.2 pg (ref 26.0–34.0)
MCHC: 32.7 g/dL (ref 30.0–36.0)
MCV: 98.7 fL (ref 80.0–100.0)
Monocytes Absolute: 0.6 10*3/uL (ref 0.1–1.0)
Monocytes Relative: 6 %
Neutro Abs: 10 10*3/uL — ABNORMAL HIGH (ref 1.7–7.7)
Neutrophils Relative %: 90 %
Platelets: 166 10*3/uL (ref 150–400)
RBC: 4.56 MIL/uL (ref 4.22–5.81)
RDW: 12.8 % (ref 11.5–15.5)
WBC: 11.2 10*3/uL — ABNORMAL HIGH (ref 4.0–10.5)
nRBC: 0 % (ref 0.0–0.2)

## 2020-05-22 LAB — COMPREHENSIVE METABOLIC PANEL
ALT: 21 U/L (ref 0–44)
AST: 20 U/L (ref 15–41)
Albumin: 3.3 g/dL — ABNORMAL LOW (ref 3.5–5.0)
Alkaline Phosphatase: 49 U/L (ref 38–126)
Anion gap: 8 (ref 5–15)
BUN: 12 mg/dL (ref 8–23)
CO2: 26 mmol/L (ref 22–32)
Calcium: 8.3 mg/dL — ABNORMAL LOW (ref 8.9–10.3)
Chloride: 102 mmol/L (ref 98–111)
Creatinine, Ser: 1.18 mg/dL (ref 0.61–1.24)
GFR calc Af Amer: >60 mL/min (ref >60)
GFR calc non Af Amer: >60 mL/min (ref >60)
Glucose, Bld: 245 mg/dL — ABNORMAL HIGH (ref 70–99)
Potassium: 5.2 mmol/L — ABNORMAL HIGH (ref 3.5–5.1)
Sodium: 136 mmol/L (ref 135–145)
Total Bilirubin: 1 mg/dL (ref 0.3–1.2)
Total Protein: 6.7 g/dL (ref 6.5–8.1)

## 2020-05-22 LAB — URINALYSIS, ROUTINE W REFLEX MICROSCOPIC
Bilirubin Urine: NEGATIVE
Glucose, UA: >=500 mg/dL — AB
Hgb urine dipstick: NEGATIVE
Ketones, ur: 20 mg/dL — AB
Leukocytes,Ua: NEGATIVE
Nitrite: NEGATIVE
Protein, ur: >=300 mg/dL — AB
Specific Gravity, Urine: 1.015 (ref 1.005–1.030)
pH: 6 (ref 5.0–8.0)

## 2020-05-22 LAB — CBG MONITORING, ED
Glucose-Capillary: 215 mg/dL — ABNORMAL HIGH (ref 70–99)
Glucose-Capillary: 211 mg/dL — ABNORMAL HIGH (ref 70–99)

## 2020-05-22 LAB — SARS CORONAVIRUS 2 BY RT PCR (HOSPITAL ORDER, PERFORMED IN ~~LOC~~ HOSPITAL LAB): SARS Coronavirus 2: NEGATIVE

## 2020-05-22 LAB — TROPONIN I (HIGH SENSITIVITY)
Troponin I (High Sensitivity): 3 ng/L (ref <18)
Troponin I (High Sensitivity): 5 ng/L (ref <18)

## 2020-05-22 LAB — LIPASE, BLOOD: Lipase: 23 U/L (ref 11–51)

## 2020-05-22 MED ORDER — ACETAMINOPHEN 650 MG RE SUPP: 650.0000 mg | Freq: Four times a day (QID) | RECTAL | Status: DC | PRN

## 2020-05-22 MED ORDER — SODIUM CHLORIDE 0.9 % IV BOLUS
1000.0000 mL | Freq: Once | INTRAVENOUS | Status: AC
  Administered 2020-05-22: 1000 mL via INTRAVENOUS

## 2020-05-22 MED ORDER — ONDANSETRON HCL 4 MG PO TABS: 4.0000 mg | ORAL_TABLET | Freq: Four times a day (QID) | ORAL | Status: DC | PRN

## 2020-05-22 MED ORDER — MORPHINE SULFATE (PF) 4 MG/ML IV SOLN
4.0000 mg | Freq: Once | INTRAVENOUS | Status: AC
  Administered 2020-05-22: 4 mg via INTRAVENOUS
  Filled 2020-05-22: qty 1

## 2020-05-22 MED ORDER — ONDANSETRON HCL 4 MG/2ML IJ SOLN
4.0000 mg | Freq: Once | INTRAMUSCULAR | Status: AC
  Administered 2020-05-22: 4 mg via INTRAVENOUS
  Filled 2020-05-22: qty 2

## 2020-05-22 MED ORDER — ACETAMINOPHEN 325 MG PO TABS
650.0000 mg | ORAL_TABLET | Freq: Four times a day (QID) | ORAL | Status: DC | PRN
  Administered 2020-05-23: 650 mg via ORAL
  Filled 2020-05-22: qty 2

## 2020-05-22 MED ORDER — ENOXAPARIN SODIUM 40 MG/0.4ML ~~LOC~~ SOLN
40.0000 mg | SUBCUTANEOUS | Status: DC
  Administered 2020-05-23 – 2020-05-24 (×2): 40 mg via SUBCUTANEOUS
  Filled 2020-05-22 (×2): qty 0.4

## 2020-05-22 MED ORDER — SODIUM CHLORIDE 0.9% FLUSH
3.0000 mL | Freq: Two times a day (BID) | INTRAVENOUS | Status: DC
  Administered 2020-05-23 – 2020-05-25 (×3): 3 mL via INTRAVENOUS

## 2020-05-22 MED ORDER — SODIUM CHLORIDE 0.9 % IV SOLN: INTRAVENOUS | Status: AC

## 2020-05-22 MED ORDER — INSULIN ASPART 100 UNIT/ML ~~LOC~~ SOLN
0.0000 [IU] | Freq: Three times a day (TID) | SUBCUTANEOUS | Status: DC
  Administered 2020-05-23 – 2020-05-24 (×3): 3 [IU] via SUBCUTANEOUS
  Administered 2020-05-24: 5 [IU] via SUBCUTANEOUS
  Administered 2020-05-24: 3 [IU] via SUBCUTANEOUS
  Administered 2020-05-25 (×2): 5 [IU] via SUBCUTANEOUS

## 2020-05-22 MED ORDER — ONDANSETRON HCL 4 MG/2ML IJ SOLN
4.0000 mg | Freq: Four times a day (QID) | INTRAMUSCULAR | Status: DC | PRN
  Filled 2020-05-22: qty 2

## 2020-05-22 MED ORDER — IOHEXOL 350 MG/ML SOLN
75.0000 mL | Freq: Once | INTRAVENOUS | Status: AC | PRN
  Administered 2020-05-22: 75 mL via INTRAVENOUS

## 2020-05-22 MED ORDER — INSULIN ASPART 100 UNIT/ML ~~LOC~~ SOLN
0.0000 [IU] | Freq: Every day | SUBCUTANEOUS | Status: DC
  Administered 2020-05-23 – 2020-05-24 (×2): 2 [IU] via SUBCUTANEOUS

## 2020-05-22 NOTE — ED Provider Notes (Signed)
[name] Provider Note   CSN: [id number] Arrival date & time: 05/22/20  1611   History Chief Complaint  Patient presents with  . Syncopal Episode   Brad Spencer is a 69 y.o. male with history significant for non-Hodgkin's lymphoma in remission, diabetes on insulin, hypertension who presents for evaluation of syncope.  Patient states he had injury to his right knee 4 days ago.  Has had pain and swelling since his knee after his grandson ran into this while playing with him.  Has had limited mobility secondary to pain.  Patient states he has been feeling generally unwell.  Has had a headache over the last 3 days which he rates a 3/10.  States he felt weak today.  He went to get to the shower when he states his pain "became too much."  Patient states the next thing he had a syncopal episode.  He denies any preceding sudden onset thunderclap headache, chest pain, shortness of breath.  Has had 3 episodes of emesis which were NBNB.  Has had 2 episodes of diarrhea over the last 5 days.  He denies any sushi abdominal pain, dysuria, melena or bright red blood per rectum. No cough, congestion, rhinorrhea, sore throat, chest pain, shortness of breath, hemoptysis. He denies any prior history of PE or DVT.  Per EMS patient had syncopal episode with them where he became bradycardic into the 20s with generalized shaking for approximately 2-3 seconds. No tongue injury or bladder incontinence. Patient denies any prior history of arrhythmias. Denies additional aggravating or relieving factors. Mild right sided neck pain that just started after syncope epsiodes. No unilateral neck pain.  History obtained from patient, family in room and past medical records.  No interpreter used.  HPI     Past Medical History:  Diagnosis Date  . Diabetes mellitus without complication ([facility])   . Kidney stone   . Stomach cancer ([facility])     There are no problems to display for this  patient.   History reviewed     No family history on file.  Social History   Tobacco Use  . Smoking status: Never Smoker  . Smokeless tobacco: Never Used  Substance Use Topics  . Alcohol use: Never  . Drug use: Never    Home Medications Prior to Admission medications   Not on File    Allergies    Chlorhexidine, Ranitidine hcl, Rosiglitazone, and Latex  Review of Systems   Review of Systems  HENT: Negative.   Respiratory: Negative.   Cardiovascular: Negative.   Gastrointestinal: Positive for diarrhea (2 episodes over 4 days), nausea and vomiting.  Genitourinary: Negative.   Musculoskeletal:       Right knee pain  Skin:       Edema to right knee  Neurological: Positive for weakness (Generalized) and headaches. Negative for dizziness, tremors, seizures, syncope, facial asymmetry, speech difficulty, light-headedness and numbness.  All other systems reviewed and are negative.   Physical Exam Updated Vital Signs BP (!) 155/88   Pulse 96   Temp 98.5 F (36.9 C) (Oral)   Resp (!) 21   Ht 6' (1.829 m)   Wt 110.7 kg   SpO2 99%   BMI 33.09 kg/m   Physical Exam Vitals and nursing note reviewed.  Constitutional:      General: He is not in acute distress.    Appearance: He is well-developed. He is not ill-appearing, toxic-appearing or diaphoretic.  HENT:     Head: Normocephalic  and atraumatic.     Jaw: There is normal jaw occlusion.     Nose: Nose normal.     Mouth/Throat:     Lips: Pink.     Mouth: Mucous membranes are dry.     Pharynx: Oropharynx is clear. Uvula midline.     Comments: Tongue midline. No injury. Eyes:     Extraocular Movements: Extraocular movements intact.     Conjunctiva/sclera: Conjunctivae normal.     Pupils: Pupils are equal, round, and reactive to light.     Visual Fields: Right eye visual fields normal and left eye visual fields normal.     Comments: No nystagmus.  PERRLA.  EOMs intact  Neck:     Trachea: Trachea and phonation  normal.      Comments: Mild tenderness to right lateral neck.  Full range of motion without difficulty.  No midline spinal tenderness. Cardiovascular:     Rate and Rhythm: Normal rate and regular rhythm.     Pulses: Normal pulses.          Radial pulses are 2+ on the right side and 2+ on the left side.       Dorsalis pedis pulses are 2+ on the right side and 2+ on the left side.       Posterior tibial pulses are 2+ on the right side and 2+ on the left side.     Heart sounds: Normal heart sounds.  Pulmonary:     Effort: Pulmonary effort is normal. No respiratory distress.     Breath sounds: Normal breath sounds and air entry.     Comments: Speaks in full sentences without difficulty.  Lungs clear to auscultation bilaterally. Abdominal:     General: Bowel sounds are normal. There is no distension.     Palpations: Abdomen is soft.     Tenderness: There is no abdominal tenderness. There is no right CVA tenderness, left CVA tenderness, guarding or rebound.     Comments: Abd soft, non tender without rebound or guarding  Musculoskeletal:     Cervical back: Normal, full passive range of motion without pain and normal range of motion.     Thoracic back: Normal.     Lumbar back: Normal.     Right knee: Swelling and effusion present. Decreased range of motion. Tenderness present over the medial joint line and lateral joint line. No patellar tendon tenderness.     Left knee: Normal.     Right lower leg: Normal.     Left lower leg: Normal.     Comments: Tenderness to right anterior knee with large effusion.  No bony tenderness to midline spine, crepitus or step-offs.  No tenderness of bilateral tib-fib, ankle or feet.  Pelvis stable, nontender palpation.  Able to flex and extend at bilateral knees however significant pain with flexion and extension to right knee.  Unable to perform varus, valgus stress secondary to pain.  Compartments soft.  Bevelyn Buckles' sign negative.  Skin:    General: Skin is warm and  dry.     Capillary Refill: Capillary refill takes less than 2 seconds.     Comments: Brisk capillary refill.  No no erythema or warmth.  Edema with effusion to right knee.  Neurological:     Mental Status: He is alert.     Comments:  Mental Status:  Alert, oriented, thought content appropriate. Speech fluent without evidence of aphasia. Able to follow 2 step commands without difficulty.  Cranial Nerves:  II:  Peripheral visual  fields grossly normal, pupils equal, round, reactive to light III,IV, VI: ptosis not present, extra-ocular motions intact bilaterally  V,VII: smile symmetric, facial light touch sensation equal VIII: hearing grossly normal bilaterally  IX,X: midline uvula rise  XI: bilateral shoulder shrug equal and strong XII: midline tongue extension  Motor:  5/5 in upper and lower extremities bilaterally including strong and equal grip strength and dorsiflexion/plantar flexion Sensory: Pinprick and light touch normal in all extremities.  Deep Tendon Reflexes: 2+ and symmetric  Cerebellar: normal finger-to-nose with bilateral upper extremities Gait: Unable to assess 2/2 right knee pain CV: distal pulses palpable throughout      ED Results / Procedures / Treatments   Labs (all labs ordered are listed, but only abnormal results are displayed) Labs Reviewed  CBC WITH DIFFERENTIAL/PLATELET - Abnormal; Notable for the following components:      Result Value   WBC 11.2 (*)    Neutro Abs 10.0 (*)    Lymphs Abs 0.5 (*)    All other components within normal limits  COMPREHENSIVE METABOLIC PANEL - Abnormal; Notable for the following components:   Potassium 5.2 (*)    Glucose, Bld 245 (*)    Calcium 8.3 (*)    Albumin 3.3 (*)    All other components within normal limits  CBG MONITORING, ED - Abnormal; Notable for the following components:   Glucose-Capillary 215 (*)    All other components within normal limits  SARS CORONAVIRUS 2 BY RT PCR (HOSPITAL ORDER, [place] [facility])  LIPASE, BLOOD  URINALYSIS, ROUTINE W REFLEX MICROSCOPIC  TROPONIN I (HIGH SENSITIVITY)  TROPONIN I (HIGH SENSITIVITY)    EKG EKG Interpretation  Date/Time:  Monday May 22 2020 16:13:11 EDT Ventricular Rate:  96 PR Interval:    QRS Duration: 94 QT Interval:  338 QTC Calculation: 428 R Axis:   -5 Text Interpretation: Sinus rhythm Ventricular premature complex Probable left atrial enlargement Anteroseptal infarct, old Confirmed by Davonna Belling [phone]) on 05/22/2020 5:57:36 PM   Radiology CT Angio Head W or Wo Contrast  Result Date: 05/22/2020 CLINICAL DATA:  Syncopal episode EXAM: CT ANGIOGRAPHY HEAD AND NECK TECHNIQUE: Multidetector CT imaging of the head and neck was performed using the standard protocol during bolus administration of intravenous contrast. Multiplanar CT image reconstructions and MIPs were obtained to evaluate the vascular anatomy. Carotid stenosis measurements (when applicable) are obtained utilizing NASCET criteria, using the distal internal carotid diameter as the denominator. CONTRAST:  22mL OMNIPAQUE IOHEXOL 350 MG/ML SOLN COMPARISON:  None. FINDINGS: CT HEAD Brain: There is no acute intracranial hemorrhage, mass effect, or edema. Gray-white differentiation is preserved. There is no extra-axial fluid collection. Ventricles and sulci are within normal limits in size and configuration. Incidental punctate focus of calcification the left aspect of the pons. Minimal patchy hypoattenuation in supratentorial white matter is nonspecific but may reflect minor chronic microvascular ischemic changes. Vascular: There is atherosclerotic calcification at the skull base. Skull: Calvarium is unremarkable. Sinuses/Orbits: No acute finding. Other: None. Review of the MIP images confirms the above findings CTA NECK Aortic arch: Great vessel origins are patent. Right carotid system: Patent. There is primarily noncalcified plaque at the ICA origin causing  approximately 60% stenosis. Left carotid system: Patent. Minimal calcified plaque at the ICA origin without measurable stenosis. Vertebral arteries: Patent and codominant. Skeleton: Degenerative changes of the cervical spine. 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 There is moderate to marked canal stenosis. Other neck: No mass  or adenopathy. Upper chest: No apical lung mass. Review of the MIP images confirms the above findings CTA HEAD Anterior circulation: Intracranial internal carotid arteries are patent with calcified plaque but no significant stenosis. Anterior and middle cerebral arteries are patent. Posterior circulation: Intracranial vertebral arteries, basilar artery, and posterior cerebral arteries are patent. Venous sinuses: Patent as allowed by contrast bolus timing. Review of the MIP images confirms the above findings IMPRESSION: No acute intracranial abnormality. No large vessel occlusion. 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 Electronically Signed   By: Macy Mis M.D.   On: 05/22/2020 20:17   DG Chest 2 View  Result Date: 05/22/2020 CLINICAL DATA:  Chest pain, shortness of breath. EXAM: CHEST - 2 VIEW COMPARISON:  August 20, 2014. FINDINGS: The heart size and mediastinal contours are within normal limits. No pneumothorax or pleural effusion is noted. Right lung is clear. Elevated left hemidiaphragm is noted with minimal left basilar subsegmental atelectasis. The visualized skeletal structures are unremarkable. IMPRESSION: Elevated left hemidiaphragm with minimal left basilar subsegmental atelectasis. Electronically Signed   By: Marijo Conception M.D.   On: 05/22/2020 17:23   CT Angio Neck W and/or Wo Contrast  Result Date: 05/22/2020 CLINICAL DATA:  Syncopal episode EXAM: CT ANGIOGRAPHY HEAD AND NECK TECHNIQUE: Multidetector CT imaging of the head and neck was performed using the standard protocol during bolus administration of intravenous  contrast. Multiplanar CT image reconstructions and MIPs were obtained to evaluate the vascular anatomy. Carotid stenosis measurements (when applicable) are obtained utilizing NASCET criteria, using the distal internal carotid diameter as the denominator. CONTRAST:  53mL OMNIPAQUE IOHEXOL 350 MG/ML SOLN COMPARISON:  None. FINDINGS: CT HEAD Brain: There is no acute intracranial hemorrhage, mass effect, or edema. Gray-white differentiation is preserved. There is no extra-axial fluid collection. Ventricles and sulci are within normal limits in size and configuration. Incidental punctate focus of calcification the left aspect of the pons. Minimal patchy hypoattenuation in supratentorial white matter is nonspecific but may reflect minor chronic microvascular ischemic changes. Vascular: There is atherosclerotic calcification at the skull base. Skull: Calvarium is unremarkable. Sinuses/Orbits: No acute finding. Other: None. Review of the MIP images confirms the above findings CTA NECK Aortic arch: Great vessel origins are patent. Right carotid system: Patent. There is primarily noncalcified plaque at the ICA origin causing approximately 60% stenosis. Left carotid system: Patent. Minimal calcified plaque at the ICA origin without measurable stenosis. Vertebral arteries: Patent and codominant. Skeleton: Degenerative changes of the cervical spine. 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 There is moderate to marked canal stenosis. Other neck: No mass or adenopathy. Upper chest: No apical lung mass. Review of the MIP images confirms the above findings CTA HEAD Anterior circulation: Intracranial internal carotid arteries are patent with calcified plaque but no significant stenosis. Anterior and middle cerebral arteries are patent. Posterior circulation: Intracranial vertebral arteries, basilar artery, and posterior cerebral arteries are patent. Venous sinuses: Patent as allowed by contrast bolus  timing. Review of the MIP images confirms the above findings IMPRESSION: No acute intracranial abnormality. No large vessel occlusion. 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 Electronically Signed   By: Macy Mis M.D.   On: 05/22/2020 20:17   DG Knee Complete 4 Views Right  Result Date: 05/22/2020 CLINICAL DATA:  Right knee pain. EXAM: RIGHT KNEE - COMPLETE 4+ VIEW COMPARISON:  None. FINDINGS: No fracture or dislocation is noted. Moderate suprapatellar joint effusion is noted. Moderate narrowing of patellofemoral space  is noted with osteophyte formation. Medial and lateral joint spaces are unremarkable. IMPRESSION: Moderate suprapatellar joint effusion. 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 No fracture or dislocation is noted. Electronically Signed   By: Marijo Conception M.D.   On: 05/22/2020 17:25    Procedures Procedures (including critical care time)  Medications Ordered in ED Medications  ondansetron (ZOFRAN) injection 4 mg (4 mg Intravenous Given 05/22/20 1743)  sodium chloride 0.9 % bolus 1,000 mL (0 mLs Intravenous Stopped 05/22/20 1845)  morphine 4 MG/ML injection 4 mg (4 mg Intravenous Given 05/22/20 1745)  iohexol (OMNIPAQUE) 350 MG/ML injection 75 mL (75 mLs Intravenous Contrast Given 05/22/20 2004)   ED Course  I have reviewed the triage vital signs and the nursing notes.  Pertinent labs & imaging results that were available during my care of the patient were reviewed by me and considered in my medical decision making (see chart for details).  69 year old male presents for evaluation of 2 syncopal episodes at home.  1 witnessed by EMS for patient became bradycardic into the 20s.  Apparently had generalized shaking for approximately 2 seconds without tongue biting, bladder incontinence when transferring from chair to stretcher.  No postictal period.  Had a headache over the last 3 days denies sudden onset thunderclap headache.  Patient nonfocal  neuro exam without deficits.  He does have a large effusion and edema to his knee however knee does not appear septic.  No overlying erythema or warmth.  He injured this while playing with his grandson.  No clinical evidence of DVT on exam.  He is without tachycardia or hypoxia.  No preceding chest pain, shortness of breath prior to syncope.  No hypotension.  Afebrile, nonseptic appearing.  Plan for labs, imaging and reassess  Labs imaging personally read interpreted Chest x-ray without acute findings Plain film knee with effusion and degenerative changes without acute fracture or dislocation. CTA  with  60% stenosis at right ICA Troponin 3, pending delta CMP with mild hyperkalemia at 5.2. No EKG changes. Lipase 23 CBC with leukocytosis at 3.2 Covid pending EKG without STEMI  Patient reassessed.  Discussed plan with admission for repeat syncope with patient and family.  He is agreeable to this.  Patient requesting fluid to be drained off his knee.  Discussed with patient we do not typically drain hemarthrosis at this is likely blood and here with high risk of infection of introducing needle.  He is okay with this.  Work-up overall reassuring however given multiple syncopal events and bradycardic into the 20s with EMS feel patient would benefit from further work-up of syncope and observation overnight.  Low suspicion for PE, ACS, dissection, or infectious cause of syncope.  Consult with Dr. Damita Dunnings with [facility] who will evaluate patient for admission.  Discussed patient with attending, Dr. Alvino Chapel who agrees above treatment, plan and position.  The patient appears reasonably stabilized for admission considering the current resources, flow, and capabilities available in the ED at this time, and I doubt any other [facility] requiring further screening and/or treatment in the ED prior to admission.    MDM Rules/Calculators/A&P                           Final Clinical Impression(s) / ED Diagnoses Final  diagnoses:  Acute pain of right knee  Effusion of right knee  Syncope, unspecified syncope type    Rx / DC Orders ED Discharge Orders    None  Malajah Oceguera A, PA-C 05/22/20 2138    Davonna Belling, MD 05/22/20 [phone]

## 2020-05-22 NOTE — H&P (Signed)
History and Physical    Brad Spencer PYK:[id number] DOB: 1951/06/10 DOA: 05/22/2020  PCP: Brad Nakai, MD   Patient coming from: home  I have personally briefly reviewed patient's old medical records in [place]  Chief Complaint: Syncope HPI: Brad Spencer is a 69 y.o. male with medical history significant for diabetes and history of non-Hodgkin's lymphoma who presents to the emergency room following a suspected syncopal episode.  Patient had been in his usual state of health until 4 days ago when he sustained a fall while playing with his grandson sustaining injury to the right knee.  He states that he was able to ambulate on the knee until yesterday when he awoke and noticed increased pain and increased swelling.  Today while in the shower he states he made a bad move his knee started hurting more and the next thing he passed out.  He did not lose consciousness but felt like he was about to.  Upon recovering he had 2 episodes of nonbloody nonbilious episodes.  He denies abdominal pain denies diarrhea as well as fever or chills. On arrival of EMS he had another episode while being transferred into the stretcher and almost passed out. He did have some shaking according to EMS but not jerking movements and it only lasted a few seconds.  He reportedly bradycardia down to the 20s during the episode.  He denied chest pain or shortness of breath.  Patient's main complaint is the pain and swelling of the right knee. ED Course: On arrival patient was awake and oriented.  His work-up was significant for slightly elevated blood sugar of 245 with ketones in the urine but otherwise mostly unremarkable.  Troponin was 3, lipase 23, WBC slightly elevated at 11,000.  EKG showed a few VPCs but no acute ST-T wave changes.  Chest x-ray benign.  CT angio head and neck showed no LVO.  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  He also had an x-ray of the knee that showed moderate suprapatellar joint  effusion with some degenerative changes.  Hospitalist consulted for admission.  Review of Systems: As per HPI otherwise 10 point review of systems negative.    Past Medical History:  Diagnosis Date  . Diabetes mellitus without complication ([place])   . Kidney stone   . Stomach cancer [facility])     Past Surgical History:  Procedure Laterality Date  . ABDOMINAL SURGERY    . KNEE SURGERY       reports that he has never smoked. He has never used smokeless tobacco. He reports that he does not drink alcohol and does not use drugs.  Allergies  Allergen Reactions  . Chlorhexidine Dermatitis  . Ranitidine Hcl Nausea And Vomiting  . Rosiglitazone Nausea And Vomiting  . Latex Rash    No family history on file.   Prior to Admission medications   Medication Sig Start Date End Date Taking? Authorizing Provider  acetaminophen (TYLENOL) 500 MG tablet Take 1,000 mg by mouth every 6 (six) hours as needed (pain).   Yes [provider]  ezetimibe (ZETIA) 10 MG tablet Take 10 mg by mouth daily with supper. 05/19/20  Yes [provider]  insulin NPH-regular Human (70-30) 100 UNIT/ML injection Inject 40 Units into the skin 2 (two) times daily before a meal.   Yes [provider]  metFORMIN (GLUCOPHAGE) 850 MG tablet Take 850 mg by mouth 2 (two) times daily with a meal. 05/19/20  Yes [provider]  Physical Exam: Vitals:   05/22/20 1612 05/22/20 1749 05/22/20 1915 05/22/20 1930  BP: (!) 145/71  (!) 146/86 (!) 155/88  Pulse: 96  95 96  Resp: (!) 24  20 (!) 21  Temp: 98.5 F (36.9 C)     TempSrc: Oral     SpO2: 92%  99% 99%  Weight: 110.7 kg 110.7 kg    Height: 6' (1.829 m) 6' (1.829 m)       Vitals:   05/22/20 1612 05/22/20 1749 05/22/20 1915 05/22/20 1930  BP: (!) 145/71  (!) 146/86 (!) 155/88  Pulse: 96  95 96  Resp: (!) 24  20 (!) 21  Temp: 98.5 F (36.9 C)     TempSrc: Oral     SpO2: 92%  99% 99%  Weight: 110.7 kg 110.7 kg    Height: 6' (1.829  m) 6' (1.829 m)        Constitutional: Alert and oriented x 3 . Not in any apparent distress HEENT:      Head: Normocephalic and atraumatic.         Eyes: PERLA, EOMI, Conjunctivae are normal. Sclera is non-icteric.       Mouth/Throat: Mucous membranes are moist.       Neck: Supple with no signs of meningismus. Cardiovascular: Regular rate and rhythm. No murmurs, gallops, or rubs. 2+ symmetrical distal pulses are present . No JVD. No LE edema Respiratory: Respiratory effort normal .Lungs sounds clear bilaterally. No wheezes, crackles, or rhonchi.  Gastrointestinal: Soft, non tender, and non distended with positive bowel sounds. No rebound or guarding. Genitourinary: No CVA tenderness. Musculoskeletal:  Swelling right knee pain on range of motion. No edema, cyanosis, or erythema of extremities. Neurologic: Normal speech and language. Face is symmetric. Moving all extremities. No gross focal neurologic deficits . Skin: Skin is warm, dry.  No rash or ulcers Psychiatric: Mood and affect are normal Speech and behavior are normal   Labs on Admission: I have personally reviewed following labs and imaging studies  CBC: Recent Labs  Lab 05/22/20 1828  WBC 11.2*  NEUTROABS 10.0*  HGB 14.7  HCT 45.0  MCV 98.7  PLT 371   Basic Metabolic Panel: Recent Labs  Lab 05/22/20 1828  NA 136  K 5.2*  CL 102  CO2 26  GLUCOSE 245*  BUN 12  CREATININE 1.18  CALCIUM 8.3*   GFR: Estimated Creatinine Clearance: 78 mL/min (by C-G formula based on SCr of 1.18 mg/dL). Liver Function Tests: Recent Labs  Lab 05/22/20 1828  AST 20  ALT 21  ALKPHOS 49  BILITOT 1.0  PROT 6.7  ALBUMIN 3.3*   Recent Labs  Lab 05/22/20 1828  LIPASE 23   No results for input(s): AMMONIA in the last 168 hours. Coagulation Profile: No results for input(s): INR, PROTIME in the last 168 hours. Cardiac Enzymes: No results for input(s): CKTOTAL, CKMB, CKMBINDEX, TROPONINI in the last 168 hours. BNP (last 3  results) No results for input(s): PROBNP in the last 8760 hours. HbA1C: No results for input(s): HGBA1C in the last 72 hours. CBG: Recent Labs  Lab 05/22/20 1933  GLUCAP 215*   Lipid Profile: No results for input(s): CHOL, HDL, LDLCALC, TRIG, CHOLHDL, LDLDIRECT in the last 72 hours. Thyroid Function Tests: No results for input(s): TSH, T4TOTAL, FREET4, T3FREE, THYROIDAB in the last 72 hours. Anemia Panel: No results for input(s): VITAMINB12, FOLATE, FERRITIN, TIBC, IRON, RETICCTPCT in the last 72 hours. Urine analysis:    Component Value Date/Time  COLORURINE YELLOW 05/22/2020 2104   APPEARANCEUR CLEAR 05/22/2020 2104   LABSPEC 1.015 05/22/2020 2104   PHURINE 6.0 05/22/2020 2104   GLUCOSEU >=500 (A) 05/22/2020 2104   HGBUR NEGATIVE 05/22/2020 2104   BILIRUBINUR NEGATIVE 05/22/2020 2104   KETONESUR 20 (A) 05/22/2020 2104   PROTEINUR >=300 (A) 05/22/2020 2104   NITRITE NEGATIVE 05/22/2020 2104   LEUKOCYTESUR NEGATIVE 05/22/2020 2104    Radiological Exams on Admission: CT Angio Head W or Wo Contrast  Result Date: 05/22/2020 CLINICAL DATA:  Syncopal episode EXAM: CT ANGIOGRAPHY HEAD AND NECK TECHNIQUE: Multidetector CT imaging of the head and neck was performed using the standard protocol during bolus administration of intravenous contrast. Multiplanar CT image reconstructions and MIPs were obtained to evaluate the vascular anatomy. Carotid stenosis measurements (when applicable) are obtained utilizing NASCET criteria, using the distal internal carotid diameter as the denominator. CONTRAST:  54mL OMNIPAQUE IOHEXOL 350 MG/ML SOLN COMPARISON:  None. FINDINGS: CT HEAD Brain: There is no acute intracranial hemorrhage, mass effect, or edema. Gray-white differentiation is preserved. There is no extra-axial fluid collection. Ventricles and sulci are within normal limits in size and configuration. Incidental punctate focus of calcification the left aspect of the pons. Minimal patchy  hypoattenuation in supratentorial white matter is nonspecific but may reflect minor chronic microvascular ischemic changes. Vascular: There is atherosclerotic calcification at the skull base. Skull: Calvarium is unremarkable. Sinuses/Orbits: No acute finding. Other: None. Review of the MIP images confirms the above findings CTA NECK Aortic arch: Great vessel origins are patent. Right carotid system: Patent. There is primarily noncalcified plaque at the ICA origin causing approximately 60% stenosis. Left carotid system: Patent. Minimal calcified plaque at the ICA origin without measurable stenosis. Vertebral arteries: Patent and codominant. Skeleton: Degenerative changes of the cervical spine. 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 There is moderate to marked canal stenosis. Other neck: No mass or adenopathy. Upper chest: No apical lung mass. Review of the MIP images confirms the above findings CTA HEAD Anterior circulation: Intracranial internal carotid arteries are patent with calcified plaque but no significant stenosis. Anterior and middle cerebral arteries are patent. Posterior circulation: Intracranial vertebral arteries, basilar artery, and posterior cerebral arteries are patent. Venous sinuses: Patent as allowed by contrast bolus timing. Review of the MIP images confirms the above findings IMPRESSION: No acute intracranial abnormality. No large vessel occlusion. 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 Electronically Signed   By: Macy Mis M.D.   On: 05/22/2020 20:17   DG Chest 2 View  Result Date: 05/22/2020 CLINICAL DATA:  Chest pain, shortness of breath. EXAM: CHEST - 2 VIEW COMPARISON:  August 20, 2014. FINDINGS: The heart size and mediastinal contours are within normal limits. No pneumothorax or pleural effusion is noted. Right lung is clear. Elevated left hemidiaphragm is noted with minimal left basilar subsegmental atelectasis. The visualized  skeletal structures are unremarkable. IMPRESSION: Elevated left hemidiaphragm with minimal left basilar subsegmental atelectasis. Electronically Signed   By: Marijo Conception M.D.   On: 05/22/2020 17:23   CT Angio Neck W and/or Wo Contrast  Result Date: 05/22/2020 CLINICAL DATA:  Syncopal episode EXAM: CT ANGIOGRAPHY HEAD AND NECK TECHNIQUE: Multidetector CT imaging of the head and neck was performed using the standard protocol during bolus administration of intravenous contrast. Multiplanar CT image reconstructions and MIPs were obtained to evaluate the vascular anatomy. Carotid stenosis measurements (when applicable) are obtained utilizing NASCET criteria, using the distal internal carotid diameter as the denominator.  CONTRAST:  63mL OMNIPAQUE IOHEXOL 350 MG/ML SOLN COMPARISON:  None. FINDINGS: CT HEAD Brain: There is no acute intracranial hemorrhage, mass effect, or edema. Gray-white differentiation is preserved. There is no extra-axial fluid collection. Ventricles and sulci are within normal limits in size and configuration. Incidental punctate focus of calcification the left aspect of the pons. Minimal patchy hypoattenuation in supratentorial white matter is nonspecific but may reflect minor chronic microvascular ischemic changes. Vascular: There is atherosclerotic calcification at the skull base. Skull: Calvarium is unremarkable. Sinuses/Orbits: No acute finding. Other: None. Review of the MIP images confirms the above findings CTA NECK Aortic arch: Great vessel origins are patent. Right carotid system: Patent. There is primarily noncalcified plaque at the ICA origin causing approximately 60% stenosis. Left carotid system: Patent. Minimal calcified plaque at the ICA origin without measurable stenosis. Vertebral arteries: Patent and codominant. Skeleton: Degenerative changes of the cervical spine. 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 There is moderate to marked canal  stenosis. Other neck: No mass or adenopathy. Upper chest: No apical lung mass. Review of the MIP images confirms the above findings CTA HEAD Anterior circulation: Intracranial internal carotid arteries are patent with calcified plaque but no significant stenosis. Anterior and middle cerebral arteries are patent. Posterior circulation: Intracranial vertebral arteries, basilar artery, and posterior cerebral arteries are patent. Venous sinuses: Patent as allowed by contrast bolus timing. Review of the MIP images confirms the above findings IMPRESSION: No acute intracranial abnormality. No large vessel occlusion. 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 Electronically Signed   By: Macy Mis M.D.   On: 05/22/2020 20:17   DG Knee Complete 4 Views Right  Result Date: 05/22/2020 CLINICAL DATA:  Right knee pain. EXAM: RIGHT KNEE - COMPLETE 4+ VIEW COMPARISON:  None. FINDINGS: No fracture or dislocation is noted. Moderate suprapatellar joint effusion is noted. Moderate narrowing of patellofemoral space is noted with osteophyte formation. Medial and lateral joint spaces are unremarkable. IMPRESSION: Moderate suprapatellar joint effusion. 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 No fracture or dislocation is noted. Electronically Signed   By: Marijo Conception M.D.   On: 05/22/2020 17:25    EKG: Independently reviewed.   Assessment/Plan Active Problems:   Syncope and collapse -Patient with 2 syncopal episodes of uncertain etiology.  Preceded by diarrheal illness, in the setting of acute pain from a recent fall -Etiology uncertain.  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  Negative troponin -IV hydration -Monitor orthostatics, neurochecks every 4 -Syncope work-up to include continuous cardiac monitoring, echocardiogram, EEG -Continue to trend troponin    Type 2  diabetes mellitus without complication (HCC) -Sliding scale insulin coverage for now    Right knee pain with sprepatellar effusion   Fall at home, initial encounter -Pain management -Knee x-ray showing moderate prepatellar effusion -Continue brace -Consider orthopedic consult/PT consult this patient having pain with ambulation.    History of non-Hodgkin's lymphoma -No acute concerns    DVT prophylaxis: Lovenox  Code Status: full code  Family Communication:  none  Disposition Plan: Back to previous home environment Consults called:  none Status:obs    Athena Masse MD [facility]     05/22/2020, 9:47 PM

## 2020-05-22 NOTE — ED Notes (Signed)
Admitting MD at bedside.

## 2020-05-22 NOTE — ED Triage Notes (Signed)
Pt to ED via [facility] after reported having a syncopal episode at home.  Pt st's he injured his right knee on Sat. And the pain from his knee is what caused the syncopal episode.  EMS reports while removing pt from his house via stretcher chair pt had another syncopal episode with seizure involvement   On arrival to ED pt alert and oriented x's 3   Wife at bedside.   EMS gave pt Fentanyl 159mcg IV for knee pain and NS 554ml

## 2020-05-22 NOTE — ED Notes (Signed)
Pt to CT at this time.

## 2020-05-22 NOTE — Progress Notes (Signed)
EEG complete - results pending 

## 2020-05-22 NOTE — ED Notes (Signed)
EEG being obtained at this time

## 2020-05-23 ENCOUNTER — Encounter (HOSPITAL_COMMUNITY): Payer: Self-pay | Admitting: *Deleted

## 2020-05-23 ENCOUNTER — Observation Stay (HOSPITAL_COMMUNITY): Payer: PPO

## 2020-05-23 ENCOUNTER — Other Ambulatory Visit: Payer: Self-pay

## 2020-05-23 DIAGNOSIS — Z20822 Contact with and (suspected) exposure to covid-19: Secondary | ICD-10-CM | POA: Diagnosis present

## 2020-05-23 DIAGNOSIS — Z79899 Other long term (current) drug therapy: Secondary | ICD-10-CM | POA: Diagnosis not present

## 2020-05-23 DIAGNOSIS — M009 Pyogenic arthritis, unspecified: Secondary | ICD-10-CM | POA: Diagnosis present

## 2020-05-23 DIAGNOSIS — E875 Hyperkalemia: Secondary | ICD-10-CM | POA: Diagnosis present

## 2020-05-23 DIAGNOSIS — W19XXXA Unspecified fall, initial encounter: Secondary | ICD-10-CM | POA: Diagnosis present

## 2020-05-23 DIAGNOSIS — Z85028 Personal history of other malignant neoplasm of stomach: Secondary | ICD-10-CM | POA: Diagnosis not present

## 2020-05-23 DIAGNOSIS — E1165 Type 2 diabetes mellitus with hyperglycemia: Secondary | ICD-10-CM | POA: Diagnosis not present

## 2020-05-23 DIAGNOSIS — Y9389 Activity, other specified: Secondary | ICD-10-CM | POA: Diagnosis not present

## 2020-05-23 DIAGNOSIS — R197 Diarrhea, unspecified: Secondary | ICD-10-CM | POA: Diagnosis present

## 2020-05-23 DIAGNOSIS — Z888 Allergy status to other drugs, medicaments and biological substances status: Secondary | ICD-10-CM | POA: Diagnosis not present

## 2020-05-23 DIAGNOSIS — Y929 Unspecified place or not applicable: Secondary | ICD-10-CM | POA: Diagnosis not present

## 2020-05-23 DIAGNOSIS — S83241A Other tear of medial meniscus, current injury, right knee, initial encounter: Secondary | ICD-10-CM | POA: Diagnosis present

## 2020-05-23 DIAGNOSIS — R55 Syncope and collapse: Secondary | ICD-10-CM

## 2020-05-23 DIAGNOSIS — Z794 Long term (current) use of insulin: Secondary | ICD-10-CM | POA: Diagnosis not present

## 2020-05-23 DIAGNOSIS — Z8572 Personal history of non-Hodgkin lymphomas: Secondary | ICD-10-CM | POA: Diagnosis not present

## 2020-05-23 DIAGNOSIS — G8911 Acute pain due to trauma: Secondary | ICD-10-CM | POA: Diagnosis present

## 2020-05-23 DIAGNOSIS — Y92009 Unspecified place in unspecified non-institutional (private) residence as the place of occurrence of the external cause: Secondary | ICD-10-CM | POA: Diagnosis not present

## 2020-05-23 DIAGNOSIS — R519 Headache, unspecified: Secondary | ICD-10-CM | POA: Diagnosis present

## 2020-05-23 DIAGNOSIS — M25561 Pain in right knee: Secondary | ICD-10-CM | POA: Diagnosis not present

## 2020-05-23 DIAGNOSIS — E119 Type 2 diabetes mellitus without complications: Secondary | ICD-10-CM | POA: Diagnosis not present

## 2020-05-23 DIAGNOSIS — X500XXA Overexertion from strenuous movement or load, initial encounter: Secondary | ICD-10-CM | POA: Diagnosis not present

## 2020-05-23 DIAGNOSIS — M25461 Effusion, right knee: Secondary | ICD-10-CM | POA: Diagnosis present

## 2020-05-23 DIAGNOSIS — M542 Cervicalgia: Secondary | ICD-10-CM | POA: Diagnosis present

## 2020-05-23 DIAGNOSIS — I10 Essential (primary) hypertension: Secondary | ICD-10-CM | POA: Diagnosis present

## 2020-05-23 DIAGNOSIS — I6521 Occlusion and stenosis of right carotid artery: Secondary | ICD-10-CM | POA: Diagnosis present

## 2020-05-23 DIAGNOSIS — R001 Bradycardia, unspecified: Secondary | ICD-10-CM | POA: Diagnosis present

## 2020-05-23 DIAGNOSIS — Z91040 Latex allergy status: Secondary | ICD-10-CM | POA: Diagnosis not present

## 2020-05-23 LAB — CBC
HCT: 43 % (ref 39.0–52.0)
Hemoglobin: 14.1 g/dL (ref 13.0–17.0)
MCH: 32.1 pg (ref 26.0–34.0)
MCHC: 32.8 g/dL (ref 30.0–36.0)
MCV: 97.9 fL (ref 80.0–100.0)
Platelets: 185 10*3/uL (ref 150–400)
RBC: 4.39 MIL/uL (ref 4.22–5.81)
RDW: 13 % (ref 11.5–15.5)
WBC: 10.8 10*3/uL — ABNORMAL HIGH (ref 4.0–10.5)
nRBC: 0 % (ref 0.0–0.2)

## 2020-05-23 LAB — HEMOGLOBIN A1C
Hgb A1c MFr Bld: 7.3 % — ABNORMAL HIGH (ref 4.8–5.6)
Mean Plasma Glucose: 162.81 mg/dL

## 2020-05-23 LAB — CBG MONITORING, ED
Glucose-Capillary: 215 mg/dL — ABNORMAL HIGH (ref 70–99)
Glucose-Capillary: 151 mg/dL — ABNORMAL HIGH (ref 70–99)
Glucose-Capillary: 166 mg/dL — ABNORMAL HIGH (ref 70–99)

## 2020-05-23 LAB — BASIC METABOLIC PANEL
Anion gap: 9 (ref 5–15)
BUN: 11 mg/dL (ref 8–23)
CO2: 24 mmol/L (ref 22–32)
Calcium: 8.4 mg/dL — ABNORMAL LOW (ref 8.9–10.3)
Chloride: 102 mmol/L (ref 98–111)
Creatinine, Ser: 1.25 mg/dL — ABNORMAL HIGH (ref 0.61–1.24)
GFR calc Af Amer: >60 mL/min (ref >60)
GFR calc non Af Amer: 59 mL/min — ABNORMAL LOW (ref >60)
Glucose, Bld: 189 mg/dL — ABNORMAL HIGH (ref 70–99)
Potassium: 4.6 mmol/L (ref 3.5–5.1)
Sodium: 135 mmol/L (ref 135–145)

## 2020-05-23 LAB — HIV ANTIBODY (ROUTINE TESTING W REFLEX): HIV Screen 4th Generation wRfx: NONREACTIVE

## 2020-05-23 LAB — GLUCOSE, CAPILLARY
Glucose-Capillary: 172 mg/dL — ABNORMAL HIGH (ref 70–99)
Glucose-Capillary: 167 mg/dL — ABNORMAL HIGH (ref 70–99)

## 2020-05-23 MED ORDER — KETOROLAC TROMETHAMINE 15 MG/ML IJ SOLN
15.0000 mg | Freq: Four times a day (QID) | INTRAMUSCULAR | Status: DC | PRN
  Administered 2020-05-23 (×3): 15 mg via INTRAVENOUS
  Filled 2020-05-23 (×3): qty 1

## 2020-05-23 MED ORDER — TRAMADOL HCL 50 MG PO TABS
50.0000 mg | ORAL_TABLET | Freq: Four times a day (QID) | ORAL | Status: DC | PRN
  Filled 2020-05-23: qty 1

## 2020-05-23 MED ORDER — ZOLPIDEM TARTRATE 5 MG PO TABS
5.0000 mg | ORAL_TABLET | Freq: Every evening | ORAL | Status: DC | PRN
  Administered 2020-05-23 – 2020-05-24 (×2): 5 mg via ORAL
  Filled 2020-05-23 (×2): qty 1

## 2020-05-23 NOTE — Progress Notes (Signed)
NEW ADMISSION NOTE New Admission Note:   Arrival Method: stretcher from ED Mental Orientation: axox4 Telemetry: 19m11 Assessment: Completed Skin: clean dry and intact IV: Rhand and RAC SL  Pain: denies at the moment  Tubes: none Safety Measures: Safety Fall Prevention Plan has been discussed Admission: Completed [facility] Orientation: Patient has been orientated to the room, unit and staff.  Family: Wife at the bedside   Orders have been reviewed and implemented. Will continue to monitor the patient. Call light has been placed within reach and bed alarm has been activated.   Paulla Fore, RN, BSN

## 2020-05-23 NOTE — Progress Notes (Signed)
Orthopedic Tech Progress Note Patient Details:  Brad Spencer 08/17/1951 [id number] Called in order HANGER for a ROM KNEE BRACE Patient ID: Brad Spencer, male   DOB: 1951/09/08, 69 y.o.   MRN: [id number]   Janit Pagan 05/23/2020, 11:43 AM

## 2020-05-23 NOTE — ED Notes (Signed)
Lunch Tray Ordered @ 1048. °

## 2020-05-23 NOTE — Evaluation (Signed)
Physical Therapy Evaluation Patient Details Name: Brad Spencer MRN: [id number] DOB: 1951-07-27 Today's Date: 05/23/2020   History of Present Illness  Pt is a 69 y/o male admitted secondary to syncopal episode, likely from intense pain in R knee. Ortho recommendations pending and pt for MRI of R knee. PMH includes DM, non-hodgekin lymphoma, and HTN.   Clinical Impression  Pt admitted secondary to problem above with deficits below. Mobility limited secondary to pain. Pt also seen before ortho recs were written, so mobility limited to bed mobility. Pt requiring assist for RLE management. Pt reporting increased pain with all movement. Positioned pt's RLE on pillow at end of session and pt reports that helped. PT follow up recommendations TBD pending mobility progression. Anticipate pt will require RW at d/c for increased safety. Would also benefit from tub bench, as pt reports it is very difficult for him to get into and out of his tub. Will continue to follow acutely and update recommendations based on pt progression.      Follow Up Recommendations Other (comment) (TBD pending mobility progression )    Equipment Recommendations  Rolling walker with 5" wheels; Tub bench   Recommendations for Other Services   OT consult    Precautions / Restrictions Precautions Precautions: Fall Restrictions Other Position/Activity Restrictions: Pending ortho recs. Maintained NWB until ortho recs in.       Mobility  Bed Mobility Overal bed mobility: Needs Assistance Bed Mobility: Supine to Sit;Sit to Supine     Supine to sit: Min assist Sit to supine: Min assist   General bed mobility comments: Required assist for RLE management. Increased time required to sitting. Further mobility deferred pending ortho recs. Pt was able to perform lateral transfer from stretcher to regular bed with assist for RLE management.   Transfers                 General transfer comment: Deferred pending ortho  recs  Ambulation/Gait                Stairs            Wheelchair Mobility    Modified Rankin (Stroke Patients Only)       Balance Overall balance assessment: Needs assistance;Mild deficits observed, not formally tested Sitting-balance support: No upper extremity supported Sitting balance-Leahy Scale: Good                                       Pertinent Vitals/Pain Pain Assessment: 0-10 Pain Score: 8  Pain Location: R knee  Pain Descriptors / Indicators: Sharp;Guarding;Grimacing Pain Intervention(s): Limited activity within patient's tolerance;Monitored during session;Repositioned    Home Living Family/patient expects to be discharged to:: Private residence Living Arrangements: Spouse/significant other Available Help at Discharge: Family Type of Home: House Home Access: Stairs to enter Entrance Stairs-Rails: None Entrance Stairs-Number of Steps: 1 Home Layout: Two level;Able to live on main level with bedroom/bathroom Home Equipment: Crutches      Prior Function Level of Independence: Independent;Independent with assistive device(s)         Comments: Has been using crutches the day prior to admission. Otherwise was previously independent.      Hand Dominance        Extremity/Trunk Assessment   Upper Extremity Assessment Upper Extremity Assessment: Defer to OT evaluation    Lower Extremity Assessment Lower Extremity Assessment: RLE deficits/detail RLE Deficits / Details: Limited ROM secondary to  pain. Increased pain with knee flexion     Cervical / Trunk Assessment Cervical / Trunk Assessment: Normal  Communication   Communication: No difficulties  Cognition Arousal/Alertness: Awake/alert Behavior During Therapy: WFL for tasks assessed/performed Overall Cognitive Status: Within Functional Limits for tasks assessed                                        General Comments General comments (skin  integrity, edema, etc.): Pt's wife present during session.     Exercises     Assessment/Plan    PT Assessment Patient needs continued PT services  PT Problem List Decreased strength;Decreased range of motion;Decreased activity tolerance;Decreased balance;Decreased mobility;Decreased knowledge of use of DME;Decreased knowledge of precautions;Pain       PT Treatment Interventions Gait training;DME instruction;Functional mobility training;Therapeutic activities;Therapeutic exercise;Balance training;Stair training;Patient/family education    PT Goals (Current goals can be found in the Care Plan section)  Acute Rehab PT Goals Patient Stated Goal: to go home  PT Goal Formulation: With patient Time For Goal Achievement: 06/06/20 Potential to Achieve Goals: Good    Frequency Min 3X/week   Barriers to discharge        Co-evaluation               AM-PAC PT "6 Clicks" Mobility  Outcome Measure Help needed turning from your back to your side while in a flat bed without using bedrails?: A Little Help needed moving from lying on your back to sitting on the side of a flat bed without using bedrails?: A Little Help needed moving to and from a bed to a chair (including a wheelchair)?: A Lot Help needed standing up from a chair using your arms (e.g., wheelchair or bedside chair)?: A Lot Help needed to walk in hospital room?: A Lot Help needed climbing 3-5 steps with a railing? : Total 6 Click Score: 13    End of Session Equipment Utilized During Treatment: Gait belt Activity Tolerance: Patient limited by pain Patient left: in bed;with call bell/phone within reach;with family/visitor present (in ED ) Nurse Communication: Mobility status PT Visit Diagnosis: Unsteadiness on feet (R26.81);Difficulty in walking, not elsewhere classified (R26.2);Pain Pain - Right/Left: Right Pain - part of body: Knee    Time: 1121-1150 PT Time Calculation (min) (ACUTE ONLY): 29 min   Charges:   PT  Evaluation $PT Eval Moderate Complexity: 1 Mod PT Treatments $Therapeutic Activity: 8-22 mins        Lou Miner, DPT  Acute Rehabilitation Services  Pager: [phone] Office: [phone]   Rudean Hitt 05/23/2020, 4:16 PM

## 2020-05-23 NOTE — Procedures (Signed)
Patient Name: Brad Spencer  MRN: [id number]  Epilepsy Attending: Lora Havens  Referring Physician/Provider: Dr. Judd Gaudier Date: 05/22/2020 Duration: 24.06 minutes  Patient history: 69 year old male who presented with 2 episodes of syncope.  EEG evaluate for seizures.  Level of alertness: Awake  AEDs during EEG study: None  Technical aspects: This EEG study was done with scalp electrodes positioned according to the 10-20 International system of electrode placement. Electrical activity was acquired at a sampling rate of 500Hz  and reviewed with a high frequency filter of 70Hz  and a low frequency filter of 1Hz . EEG data were recorded continuously and digitally stored.   Description: The posterior dominant rhythm consists of 8 Hz activity of moderate voltage (25-35 uV) seen predominantly in posterior head regions, symmetric and reactive to eye opening and eye closing.  Physiology photic driving was seen during photic stimulation.  Hyperventilation was not performed.     IMPRESSION: This study is within normal limits. No seizures or epileptiform discharges were seen throughout the recording.  Kalla Watson Barbra Sarks

## 2020-05-23 NOTE — Consult Note (Signed)
Reason for Consult:Right knee pain Referring Physician: Brandt Chaney is an 69 y.o. male.  HPI: Brook was playing with his grandson on Saturday and hurt his right knee when he twisted. This did not cause him to fall and he was able to bear weight. On Sunday it was worse but he was still able to get up and go to church and lunch. Monday he was unable to get out of the shower without help and essentially lost consciousness from the pain when he was helped into bed. They called 911 at that point and he was brought to the ED. He was admitted for a syncopal workup and orthopedic surgery was consulted the following day. 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 He denies hx/o gout.  Past Medical History:  Diagnosis Date  . Diabetes mellitus without complication ([facility])   . Kidney stone   . Stomach cancer [facility])     Past Surgical History:  Procedure Laterality Date  . ABDOMINAL SURGERY    . KNEE SURGERY      No family history on file.  Social History:  reports that he has never smoked. He has never used smokeless tobacco. He reports that he does not drink alcohol and does not use drugs.  Allergies:  Allergies  Allergen Reactions  . Chlorhexidine Dermatitis  . Ranitidine Hcl Nausea And Vomiting  . Rosiglitazone Nausea And Vomiting  . Latex Rash    Medications: I have reviewed the patient's current medications.  Results for orders placed or performed during the hospital encounter of 05/22/20 (from the past 48 hour(s))  CBC with Differential     Status: Abnormal   Collection Time: 05/22/20  6:28 PM  Result Value Ref Range   WBC 11.2 (H) 4.0 - 10.5 K/uL   RBC 4.56 4.22 - 5.81 MIL/uL   Hemoglobin 14.7 13.0 - 17.0 g/dL   HCT 45.0 39 - 52 %   MCV 98.7 80.0 - 100.0 fL   MCH 32.2 26.0 - 34.0 pg   MCHC 32.7 30.0 - 36.0 g/dL   RDW 12.8 11.5 - 15.5 %   Platelets 166 150 - 400  K/uL   nRBC 0.0 0.0 - 0.2 %   Neutrophils Relative % 90 %   Neutro Abs 10.0 (H) 1.7 - 7.7 K/uL   Lymphocytes Relative 4 %   Lymphs Abs 0.5 (L) 0.7 - 4.0 K/uL   Monocytes Relative 6 %   Monocytes Absolute 0.6 0 - 1 K/uL   Eosinophils Relative 0 %   Eosinophils Absolute 0.0 0 - 0 K/uL   Basophils Relative 0 %   Basophils Absolute 0.0 0 - 0 K/uL   Immature Granulocytes 0 %   Abs Immature Granulocytes 0.05 0.00 - 0.07 K/uL    Comment: Performed at [facility], [address] [place], [place] [address]  Comprehensive metabolic panel     Status: Abnormal   Collection Time: 05/22/20  6:28 PM  Result Value Ref Range   Sodium 136 135 - 145 mmol/L   Potassium 5.2 (H) 3.5 - 5.1 mmol/L   Chloride 102 98 - 111 mmol/L   CO2 26 22 - 32 mmol/L   Glucose, Bld 245 (H) 70 - 99 mg/dL    Comment: Glucose reference range applies only to samples taken after fasting for at least 8 hours.   BUN 12  8 - 23 mg/dL   Creatinine, Ser 1.18 0.61 - 1.24 mg/dL   Calcium 8.3 (L) 8.9 - 10.3 mg/dL   Total Protein 6.7 6.5 - 8.1 g/dL   Albumin 3.3 (L) 3.5 - 5.0 g/dL   AST 20 15 - 41 U/L   ALT 21 0 - 44 U/L   Alkaline Phosphatase 49 38 - 126 U/L   Total Bilirubin 1.0 0.3 - 1.2 mg/dL   GFR calc non Af Amer >60 >60 mL/min   GFR calc Af Amer >60 >60 mL/min   Anion gap 8 5 - 15    Comment: Performed at [facility] [address], [place], [place] [address]  Lipase, blood     Status: None   Collection Time: 05/22/20  6:28 PM  Result Value Ref Range   Lipase 23 11 - 51 U/L    Comment: Performed at [facility], [place] [address], [place], [place] [address]  Troponin I (High Sensitivity)     Status: None   Collection Time: 05/22/20  6:28 PM  Result Value Ref Range   Troponin I (High Sensitivity) 3 <18 ng/L    Comment: (NOTE) Elevated high sensitivity troponin I (hsTnI) values and significant  changes across serial measurements may suggest ACS but many other  chronic and acute conditions are known  to elevate hsTnI results.  Refer to the "Links" section for chest pain algorithms and additional  guidance. Performed at [facility], [place] [address] [place], [place] [address]   POC CBG, ED     Status: Abnormal   Collection Time: 05/22/20  7:33 PM  Result Value Ref Range   Glucose-Capillary 215 (H) 70 - 99 mg/dL    Comment: Glucose reference range applies only to samples taken after fasting for at least 8 hours.  Urinalysis, Routine w reflex microscopic     Status: Abnormal   Collection Time: 05/22/20  9:04 PM  Result Value Ref Range   Color, Urine YELLOW YELLOW   APPearance CLEAR CLEAR   Specific Gravity, Urine 1.015 1.005 - 1.030   pH 6.0 5.0 - 8.0   Glucose, UA >=500 (A) NEGATIVE mg/dL   Hgb urine dipstick NEGATIVE NEGATIVE   Bilirubin Urine NEGATIVE NEGATIVE   Ketones, ur 20 (A) NEGATIVE mg/dL   Protein, ur >=300 (A) NEGATIVE mg/dL   Nitrite NEGATIVE NEGATIVE   Leukocytes,Ua NEGATIVE NEGATIVE   RBC / HPF 0-5 0 - 5 RBC/hpf   WBC, UA 0-5 0 - 5 WBC/hpf   Bacteria, UA RARE (A) NONE SEEN    Comment: Performed at [facility] [address] [place],  [address]  SARS Coronavirus 2 by RT PCR (hospital order, performed in [facility] hospital lab) Nasopharyngeal Urine, Clean Catch     Status: None   Collection Time: 05/22/20  9:04 PM   Specimen: Urine, Clean Catch; Nasopharyngeal  Result Value Ref Range   SARS Coronavirus 2 NEGATIVE NEGATIVE    Comment: (NOTE) SARS-CoV-2 target nucleic acids are NOT DETECTED.  The SARS-CoV-2 RNA is generally detectable in upper and lower respiratory specimens during the acute phase of infection. The lowest concentration of SARS-CoV-2 viral copies this assay can detect is 250 copies / mL. A negative result does not preclude SARS-CoV-2 infection and should not be used as the sole basis for treatment or other patient management decisions.  A negative result may occur with improper specimen collection / handling, submission of  specimen other than nasopharyngeal swab,  presence of viral mutation(s) within the areas targeted by this assay, and inadequate number of viral copies (<250 copies / mL). A negative result must be combined with clinical observations, patient history, and epidemiological information.  Fact Sheet for Patients:   StrictlyIdeas.no  Fact Sheet for Healthcare Providers: BankingDealers.co.za  This test is not yet approved or  cleared by the [place] FDA and has been authorized for detection and/or diagnosis of SARS-CoV-2 by FDA under an Emergency Use Authorization (EUA).  This EUA will remain in effect (meaning this test can be used) for the duration of the COVID-19 declaration under Section 564(b)(1) of the Act, 21 U.S.C. section 360bbb-3(b)(1), unless the authorization is terminated or revoked sooner.  Performed at [facility], [place] [address] [place], [place] [address]   Troponin I (High Sensitivity)     Status: None   Collection Time: 05/22/20  9:12 PM  Result Value Ref Range   Troponin I (High Sensitivity) 5 <18 ng/L    Comment: (NOTE) Elevated high sensitivity troponin I (hsTnI) values and significant  changes across serial measurements may suggest ACS but many other  chronic and acute conditions are known to elevate hsTnI results.  Refer to the "Links" section for chest pain algorithms and additional  guidance. Performed at [facility], [place] [address] [place], [place] [address]   CBG monitoring, ED     Status: Abnormal   Collection Time: 05/22/20 11:21 PM  Result Value Ref Range   Glucose-Capillary 211 (H) 70 - 99 mg/dL    Comment: Glucose reference range applies only to samples taken after fasting for at least 8 hours.  HIV Antibody (routine testing w rflx)     Status: None   Collection Time: 05/23/20  3:47 AM  Result Value Ref Range   HIV Screen 4th Generation wRfx Non Reactive Non Reactive    Comment:  Performed at [facility], [place] [address], [place], [place] [address]  Basic metabolic panel     Status: Abnormal   Collection Time: 05/23/20  3:47 AM  Result Value Ref Range   Sodium 135 135 - 145 mmol/L   Potassium 4.6 3.5 - 5.1 mmol/L   Chloride 102 98 - 111 mmol/L   CO2 24 22 - 32 mmol/L   Glucose, Bld 189 (H) 70 - 99 mg/dL    Comment: Glucose reference range applies only to samples taken after fasting for at least 8 hours.   BUN 11 8 - 23 mg/dL   Creatinine, Ser 1.25 (H) 0.61 - 1.24 mg/dL   Calcium 8.4 (L) 8.9 - 10.3 mg/dL   GFR calc non Af Amer 59 (L) >60 mL/min   GFR calc Af Amer >60 >60 mL/min   Anion gap 9 5 - 15    Comment: Performed at [facility] [address] [place], [place] [address]  CBC     Status: Abnormal   Collection Time: 05/23/20  3:47 AM  Result Value Ref Range   WBC 10.8 (H) 4.0 - 10.5 K/uL   RBC 4.39 4.22 - 5.81 MIL/uL   Hemoglobin 14.1 13.0 - 17.0 g/dL   HCT 43.0 39 - 52 %   MCV 97.9 80.0 - 100.0 fL   MCH 32.1 26.0 - 34.0 pg   MCHC 32.8 30.0 - 36.0 g/dL   RDW 13.0 11.5 - 15.5 %   Platelets 185 150 - 400 K/uL   nRBC 0.0 0.0 - 0.2 %    Comment:  Performed at [facility], [place] [address], [place], [place] [address]  CBG monitoring, ED     Status: Abnormal   Collection Time: 05/23/20  5:48 AM  Result Value Ref Range   Glucose-Capillary 151 (H) 70 - 99 mg/dL    Comment: Glucose reference range applies only to samples taken after fasting for at least 8 hours.  CBG monitoring, ED     Status: Abnormal   Collection Time: 05/23/20  8:01 AM  Result Value Ref Range   Glucose-Capillary 166 (H) 70 - 99 mg/dL    Comment: Glucose reference range applies only to samples taken after fasting for at least 8 hours.    EEG  Result Date: 05/23/2020 Lora Havens, MD     05/23/2020 10:09 AM Patient Name: Brad Spencer MRN: [id number] Epilepsy Attending: Lora Havens Referring Physician/Provider: Dr. Judd Gaudier Date: 05/22/2020 Duration:  24.06 minutes Patient history: 69 year old male who presented with 2 episodes of syncope.  EEG evaluate for seizures. Level of alertness: Awake AEDs during EEG study: None Technical aspects: This EEG study was done with scalp electrodes positioned according to the 10-20 International system of electrode placement. Electrical activity was acquired at a sampling rate of 500Hz  and reviewed with a high frequency filter of 70Hz  and a low frequency filter of 1Hz . EEG data were recorded continuously and digitally stored. Description: The posterior dominant rhythm consists of 8 Hz activity of moderate voltage (25-35 uV) seen predominantly in posterior head regions, symmetric and reactive to eye opening and eye closing.  Physiology photic driving was seen during photic stimulation.  Hyperventilation was not performed.   IMPRESSION: This study is within normal limits. No seizures or epileptiform discharges were seen throughout the recording. Lora Havens   CT Angio Head W or Wo Contrast  Result Date: 05/22/2020 CLINICAL DATA:  Syncopal episode EXAM: CT ANGIOGRAPHY HEAD AND NECK TECHNIQUE: Multidetector CT imaging of the head and neck was performed using the standard protocol during bolus administration of intravenous contrast. Multiplanar CT image reconstructions and MIPs were obtained to evaluate the vascular anatomy. Carotid stenosis measurements (when applicable) are obtained utilizing NASCET criteria, using the distal internal carotid diameter as the denominator. CONTRAST:  59mL OMNIPAQUE IOHEXOL 350 MG/ML SOLN COMPARISON:  None. FINDINGS: CT HEAD Brain: There is no acute intracranial hemorrhage, mass effect, or edema. Gray-white differentiation is preserved. There is no extra-axial fluid collection. Ventricles and sulci are within normal limits in size and configuration. Incidental punctate focus of calcification the left aspect of the pons. Minimal patchy hypoattenuation in supratentorial white matter is  nonspecific but may reflect minor chronic microvascular ischemic changes. Vascular: There is atherosclerotic calcification at the skull base. Skull: Calvarium is unremarkable. Sinuses/Orbits: No acute finding. Other: None. Review of the MIP images confirms the above findings CTA NECK Aortic arch: Great vessel origins are patent. Right carotid system: Patent. There is primarily noncalcified plaque at the ICA origin causing approximately 60% stenosis. Left carotid system: Patent. Minimal calcified plaque at the ICA origin without measurable stenosis. Vertebral arteries: Patent and codominant. Skeleton: Degenerative changes of the cervical spine. 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 There is moderate to marked canal stenosis. Other neck: No mass or adenopathy. Upper chest: No apical lung mass. Review of the MIP images confirms the above findings CTA HEAD Anterior circulation: Intracranial internal carotid arteries are patent with calcified plaque but no significant stenosis. Anterior and middle cerebral arteries are patent. Posterior circulation: Intracranial  vertebral arteries, basilar artery, and posterior cerebral arteries are patent. Venous sinuses: Patent as allowed by contrast bolus timing. Review of the MIP images confirms the above findings IMPRESSION: No acute intracranial abnormality. No large vessel occlusion. 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 Electronically Signed   By: Macy Mis M.D.   On: 05/22/2020 20:17   DG Chest 2 View  Result Date: 05/22/2020 CLINICAL DATA:  Chest pain, shortness of breath. EXAM: CHEST - 2 VIEW COMPARISON:  August 20, 2014. FINDINGS: The heart size and mediastinal contours are within normal limits. No pneumothorax or pleural effusion is noted. Right lung is clear. Elevated left hemidiaphragm is noted with minimal left basilar subsegmental atelectasis. The visualized skeletal structures are unremarkable. IMPRESSION:  Elevated left hemidiaphragm with minimal left basilar subsegmental atelectasis. Electronically Signed   By: Marijo Conception M.D.   On: 05/22/2020 17:23   CT Angio Neck W and/or Wo Contrast  Result Date: 05/22/2020 CLINICAL DATA:  Syncopal episode EXAM: CT ANGIOGRAPHY HEAD AND NECK TECHNIQUE: Multidetector CT imaging of the head and neck was performed using the standard protocol during bolus administration of intravenous contrast. Multiplanar CT image reconstructions and MIPs were obtained to evaluate the vascular anatomy. Carotid stenosis measurements (when applicable) are obtained utilizing NASCET criteria, using the distal internal carotid diameter as the denominator. CONTRAST:  92mL OMNIPAQUE IOHEXOL 350 MG/ML SOLN COMPARISON:  None. FINDINGS: CT HEAD Brain: There is no acute intracranial hemorrhage, mass effect, or edema. Gray-white differentiation is preserved. There is no extra-axial fluid collection. Ventricles and sulci are within normal limits in size and configuration. Incidental punctate focus of calcification the left aspect of the pons. Minimal patchy hypoattenuation in supratentorial white matter is nonspecific but may reflect minor chronic microvascular ischemic changes. Vascular: There is atherosclerotic calcification at the skull base. Skull: Calvarium is unremarkable. Sinuses/Orbits: No acute finding. Other: None. Review of the MIP images confirms the above findings CTA NECK Aortic arch: Great vessel origins are patent. Right carotid system: Patent. There is primarily noncalcified plaque at the ICA origin causing approximately 60% stenosis. Left carotid system: Patent. Minimal calcified plaque at the ICA origin without measurable stenosis. Vertebral arteries: Patent and codominant. Skeleton: Degenerative changes of the cervical spine. 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 There is moderate to marked canal stenosis. Other neck: No mass or adenopathy. Upper  chest: No apical lung mass. Review of the MIP images confirms the above findings CTA HEAD Anterior circulation: Intracranial internal carotid arteries are patent with calcified plaque but no significant stenosis. Anterior and middle cerebral arteries are patent. Posterior circulation: Intracranial vertebral arteries, basilar artery, and posterior cerebral arteries are patent. Venous sinuses: Patent as allowed by contrast bolus timing. Review of the MIP images confirms the above findings IMPRESSION: No acute intracranial abnormality. No large vessel occlusion. 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 Electronically Signed   By: Macy Mis M.D.   On: 05/22/2020 20:17   DG Knee Complete 4 Views Right  Result Date: 05/22/2020 CLINICAL DATA:  Right knee pain. EXAM: RIGHT KNEE - COMPLETE 4+ VIEW COMPARISON:  None. FINDINGS: No fracture or dislocation is noted. Moderate suprapatellar joint effusion is noted. Moderate narrowing of patellofemoral space is noted with osteophyte formation. Medial and lateral joint spaces are unremarkable. IMPRESSION: Moderate suprapatellar joint effusion. 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 No fracture or dislocation is noted. Electronically Signed   By: Marijo Conception M.D.   On: 05/22/2020  17:25    Review of Systems  Constitutional: Negative for chills, diaphoresis and fever.  HENT: Negative for ear discharge, ear pain, hearing loss and tinnitus.   Eyes: Negative for photophobia and pain.  Respiratory: Negative for cough and shortness of breath.   Cardiovascular: Negative for chest pain.  Gastrointestinal: Negative for abdominal pain, nausea and vomiting.  Genitourinary: Negative for dysuria, flank pain, frequency and urgency.  Musculoskeletal: Positive for arthralgias (Right knee). Negative for back pain, myalgias and neck pain.  Neurological: Positive for tremors (2/2 pain). Negative for dizziness and headaches.  Hematological: Does  not bruise/bleed easily.  Psychiatric/Behavioral: The patient is not nervous/anxious.    Blood pressure 117/61, pulse 79, temperature 98.5 F (36.9 C), temperature source Oral, resp. rate 19, height 6' (1.829 m), weight 110.7 kg, SpO2 96 %. Physical Exam  Constitutional: He appears well-developed. No distress.  HENT:  Head: Normocephalic and atraumatic.  Eyes: Conjunctivae are normal. Right eye exhibits no discharge. Left eye exhibits no discharge. No scleral icterus.  Cardiovascular: Normal rate and regular rhythm.  Respiratory: Effort normal. No respiratory distress.  Musculoskeletal:     Cervical back: Normal range of motion.     Comments: LLE No traumatic wounds, ecchymosis, or rash  Mod TTP joint line  Mod knee effusion  Knee stability unable to assess 2/2 pain  Sens SPN, TN intact, DPN paresthetic (chronic)  Motor EHL, ext, flex, evers 5/5  DP 2+, PT 2+, No significant edema  Neurological: He is alert.  Skin: Skin is warm and dry. He is not diaphoretic.  Psychiatric: His behavior is normal.    Assessment/Plan: Right knee pain -- Have ordered MRI to assess soft tissue of knee. Will order hinged knee brace for stability. Anticipate home with brace with plans for outpatient surgery depending on MRI results. May WBAT in brace but may need crutches or RW initially. No indication for arthrocentesis at this point. DM    Lisette Abu, PA-C Orthopedic Surgery [phone] 05/23/2020, 11:25 AM

## 2020-05-23 NOTE — Progress Notes (Signed)
PROGRESS NOTE    Brad Spencer  JTT:[id number] DOB: 23-Feb-1951 DOA: 05/22/2020 PCP: Cher Nakai, MD    Brief Narrative:  69 y.o. male with medical history significant for diabetes and history of non-Hodgkin's lymphoma who presents to the emergency room following a suspected syncopal episode.  Patient had been in his usual state of health until 4 days ago when he sustained a fall while playing with his grandson sustaining injury to the right knee.  He states that he was able to ambulate on the knee until yesterday when he awoke and noticed increased pain and increased swelling.  Today while in the shower he states he made a bad move his knee started hurting more and the next thing he passed out.  He did not lose consciousness but felt like he was about to.  Upon recovering he had 2 episodes of nonbloody nonbilious episodes.  He denies abdominal pain denies diarrhea as well as fever or chills. On arrival of EMS he had another episode while being transferred into the stretcher and almost passed out. He did have some shaking according to EMS but not jerking movements and it only lasted a few seconds.  He reportedly bradycardia down to the 20s during the episode.  He denied chest pain or shortness of breath.  Patient's main complaint is the pain and swelling of the right knee. ED Course: On arrival patient was awake and oriented.  His work-up was significant for slightly elevated blood sugar of 245 with ketones in the urine but otherwise mostly unremarkable.  Troponin was 3, lipase 23, WBC slightly elevated at 11,000.  EKG showed a few VPCs but no acute ST-T wave changes.  Chest x-ray benign.  CT angio head and neck showed no LVO.  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  He also had an x-ray of the knee that showed moderate suprapatellar joint effusion with some degenerative changes.  Hospitalist consulted for admission.  Assessment & Plan:   Active Problems:   Syncope and collapse   Type 2  diabetes mellitus without complication (HCC)   Right knee pain   Fall at home, initial encounter   History of non-Hodgkin's lymphoma  Active Problems:   Likely vasovagal syncope triggered by intense pain -On further questioning, patient reports near-syncopal spell that started immediately after intense knee pain, see below - Currently stable on tele - EEG performed, reviewed, no evidence of seizure activity on that study - currently on IVF hydration    Type 2 diabetes mellitus without complication (HCC) -Sliding scale insulin coverage for now -glycemic trends stable    Right knee pain secondary to medial meniscus tear with large knee effusion s/p fall -Pain management as needed -Knee x-ray showing moderate prepatellar effusion -Orthopedic Surgery consulted, had seen prior to MRI findings which were reviewed. Thus far, ortho recs for hinged knee brace for stability, WBAT. Will f/u on Ortho recs    History of non-Hodgkin's lymphoma -Appears stable at this time  DVT prophylaxis: Lovenox subq Code Status: Full Family Communication: Pt in room, family at bedside  Status is: Observation  The patient remains OBS appropriate and will d/c before 2 midnights.  Dispo: The patient is from: Home              Anticipated d/c is to: Home              Anticipated d/c date is: 1 day  Patient currently is not medically stable to d/c.  Consultants:   Orthopedic Surgery  Procedures:     Antimicrobials: Anti-infectives (From admission, onward)   None       Subjective: Complains of continued R knee swelling and pain  Objective: Vitals:   05/23/20 0645 05/23/20 0701 05/23/20 0800 05/23/20 1115  BP: (!) 117/57 114/71 (!) 125/97 117/61  Pulse: 66 63 75 79  Resp: 12 12 17 19   Temp:      TempSrc:      SpO2: 98% 98% 95% 96%  Weight:      Height:        Intake/Output Summary (Last 24 hours) at 05/23/2020 1457 Last data filed at 05/23/2020 1138 Gross per 24 hour   Intake 628.75 ml  Output --  Net 628.75 ml   Filed Weights   05/22/20 1612 05/22/20 1749  Weight: 110.7 kg 110.7 kg    Examination:  General exam: Appears calm and comfortable  Respiratory system: Clear to auscultation. Respiratory effort normal. Cardiovascular system: S1 & S2 heard, Regular Gastrointestinal system: Abdomen is nondistended, soft and nontender. No organomegaly or masses felt. Normal bowel sounds heard. Central nervous system: Alert and oriented. No focal neurological deficits. Extremities: Symmetric 5 x 5 power, L knee with old surgical changes, R knee with effusion, tenderness Skin: No rashes, lesions  Psychiatry: Judgement and insight appear normal. Mood & affect appropriate.   Data Reviewed: I have personally reviewed following labs and imaging studies  CBC: Recent Labs  Lab 05/22/20 1828 05/23/20 0347  WBC 11.2* 10.8*  NEUTROABS 10.0*  --   HGB 14.7 14.1  HCT 45.0 43.0  MCV 98.7 97.9  PLT 166 932   Basic Metabolic Panel: Recent Labs  Lab 05/22/20 1828 05/23/20 0347  NA 136 135  K 5.2* 4.6  CL 102 102  CO2 26 24  GLUCOSE 245* 189*  BUN 12 11  CREATININE 1.18 1.25*  CALCIUM 8.3* 8.4*   GFR: Estimated Creatinine Clearance: 73.6 mL/min (A) (by C-G formula based on SCr of 1.25 mg/dL (H)). Liver Function Tests: Recent Labs  Lab 05/22/20 1828  AST 20  ALT 21  ALKPHOS 49  BILITOT 1.0  PROT 6.7  ALBUMIN 3.3*   Recent Labs  Lab 05/22/20 1828  LIPASE 23   No results for input(s): AMMONIA in the last 168 hours. Coagulation Profile: No results for input(s): INR, PROTIME in the last 168 hours. Cardiac Enzymes: No results for input(s): CKTOTAL, CKMB, CKMBINDEX, TROPONINI in the last 168 hours. BNP (last 3 results) No results for input(s): PROBNP in the last 8760 hours. HbA1C: Recent Labs    05/23/20 0347  HGBA1C 7.3*   CBG: Recent Labs  Lab 05/22/20 1610 05/22/20 1933 05/22/20 2321 05/23/20 0548 05/23/20 0801  GLUCAP 215*  215* 211* 151* 166*   Lipid Profile: No results for input(s): CHOL, HDL, LDLCALC, TRIG, CHOLHDL, LDLDIRECT in the last 72 hours. Thyroid Function Tests: No results for input(s): TSH, T4TOTAL, FREET4, T3FREE, THYROIDAB in the last 72 hours. Anemia Panel: No results for input(s): VITAMINB12, FOLATE, FERRITIN, TIBC, IRON, RETICCTPCT in the last 72 hours. Sepsis Labs: No results for input(s): PROCALCITON, LATICACIDVEN in the last 168 hours.  Recent Results (from the past 240 hour(s))  SARS Coronavirus 2 by RT PCR (hospital order, performed in [facility] hospital lab) Nasopharyngeal Urine, Clean Catch     Status: None   Collection Time: 05/22/20  9:04 PM   Specimen: Urine, Clean Catch; Nasopharyngeal  Result Value Ref  Range Status   SARS Coronavirus 2 NEGATIVE NEGATIVE Final    Comment: (NOTE) SARS-CoV-2 target nucleic acids are NOT DETECTED.  The SARS-CoV-2 RNA is generally detectable in upper and lower respiratory specimens during the acute phase of infection. The lowest concentration of SARS-CoV-2 viral copies this assay can detect is 250 copies / mL. A negative result does not preclude SARS-CoV-2 infection and should not be used as the sole basis for treatment or other patient management decisions.  A negative result may occur with improper specimen collection / handling, submission of specimen other than nasopharyngeal swab, presence of viral mutation(s) within the areas targeted by this assay, and inadequate number of viral copies (<250 copies / mL). A negative result must be combined with clinical observations, patient history, and epidemiological information.  Fact Sheet for Patients:   StrictlyIdeas.no  Fact Sheet for Healthcare Providers: BankingDealers.co.za  This test is not yet approved or  cleared by the [place] FDA and has been authorized for detection and/or diagnosis of SARS-CoV-2 by FDA under an Emergency Use  Authorization (EUA).  This EUA will remain in effect (meaning this test can be used) for the duration of the COVID-19 declaration under Section 564(b)(1) of the Act, 21 U.S.C. section 360bbb-3(b)(1), unless the authorization is terminated or revoked sooner.  Performed at [facility], [place] [address], [place], [place] [address]      Radiology Studies: EEG  Result Date: 05/23/2020 Lora Havens, MD     05/23/2020 10:09 AM Patient Name: Brad Spencer MRN: [id number] Epilepsy Attending: Lora Havens Referring Physician/Provider: Dr. Judd Gaudier Date: 05/22/2020 Duration: 24.06 minutes Patient history: 69 year old male who presented with 2 episodes of syncope.  EEG evaluate for seizures. Level of alertness: Awake AEDs during EEG study: None Technical aspects: This EEG study was done with scalp electrodes positioned according to the 10-20 International system of electrode placement. Electrical activity was acquired at a sampling rate of 500Hz  and reviewed with a high frequency filter of 70Hz  and a low frequency filter of 1Hz . EEG data were recorded continuously and digitally stored. Description: The posterior dominant rhythm consists of 8 Hz activity of moderate voltage (25-35 uV) seen predominantly in posterior head regions, symmetric and reactive to eye opening and eye closing.  Physiology photic driving was seen during photic stimulation.  Hyperventilation was not performed.   IMPRESSION: This study is within normal limits. No seizures or epileptiform discharges were seen throughout the recording. Lora Havens   CT Angio Head W or Wo Contrast  Result Date: 05/22/2020 CLINICAL DATA:  Syncopal episode EXAM: CT ANGIOGRAPHY HEAD AND NECK TECHNIQUE: Multidetector CT imaging of the head and neck was performed using the standard protocol during bolus administration of intravenous contrast. Multiplanar CT image reconstructions and MIPs were obtained to evaluate the vascular anatomy. Carotid  stenosis measurements (when applicable) are obtained utilizing NASCET criteria, using the distal internal carotid diameter as the denominator. CONTRAST:  73mL OMNIPAQUE IOHEXOL 350 MG/ML SOLN COMPARISON:  None. FINDINGS: CT HEAD Brain: There is no acute intracranial hemorrhage, mass effect, or edema. Gray-white differentiation is preserved. There is no extra-axial fluid collection. Ventricles and sulci are within normal limits in size and configuration. Incidental punctate focus of calcification the left aspect of the pons. Minimal patchy hypoattenuation in supratentorial white matter is nonspecific but may reflect minor chronic microvascular ischemic changes. Vascular: There is atherosclerotic calcification at the skull base. Skull: Calvarium is unremarkable. Sinuses/Orbits: No acute finding. Other: None. Review of the MIP  images confirms the above findings CTA NECK Aortic arch: Great vessel origins are patent. Right carotid system: Patent. There is primarily noncalcified plaque at the ICA origin causing approximately 60% stenosis. Left carotid system: Patent. Minimal calcified plaque at the ICA origin without measurable stenosis. Vertebral arteries: Patent and codominant. Skeleton: Degenerative changes of the cervical spine. 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 There is moderate to marked canal stenosis. Other neck: No mass or adenopathy. Upper chest: No apical lung mass. Review of the MIP images confirms the above findings CTA HEAD Anterior circulation: Intracranial internal carotid arteries are patent with calcified plaque but no significant stenosis. Anterior and middle cerebral arteries are patent. Posterior circulation: Intracranial vertebral arteries, basilar artery, and posterior cerebral arteries are patent. Venous sinuses: Patent as allowed by contrast bolus timing. Review of the MIP images confirms the above findings IMPRESSION: No acute intracranial abnormality. No  large vessel occlusion. 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 Electronically Signed   By: Macy Mis M.D.   On: 05/22/2020 20:17   DG Chest 2 View  Result Date: 05/22/2020 CLINICAL DATA:  Chest pain, shortness of breath. EXAM: CHEST - 2 VIEW COMPARISON:  August 20, 2014. FINDINGS: The heart size and mediastinal contours are within normal limits. No pneumothorax or pleural effusion is noted. Right lung is clear. Elevated left hemidiaphragm is noted with minimal left basilar subsegmental atelectasis. The visualized skeletal structures are unremarkable. IMPRESSION: Elevated left hemidiaphragm with minimal left basilar subsegmental atelectasis. Electronically Signed   By: Marijo Conception M.D.   On: 05/22/2020 17:23   CT Angio Neck W and/or Wo Contrast  Result Date: 05/22/2020 CLINICAL DATA:  Syncopal episode EXAM: CT ANGIOGRAPHY HEAD AND NECK TECHNIQUE: Multidetector CT imaging of the head and neck was performed using the standard protocol during bolus administration of intravenous contrast. Multiplanar CT image reconstructions and MIPs were obtained to evaluate the vascular anatomy. Carotid stenosis measurements (when applicable) are obtained utilizing NASCET criteria, using the distal internal carotid diameter as the denominator. CONTRAST:  89mL OMNIPAQUE IOHEXOL 350 MG/ML SOLN COMPARISON:  None. FINDINGS: CT HEAD Brain: There is no acute intracranial hemorrhage, mass effect, or edema. Gray-white differentiation is preserved. There is no extra-axial fluid collection. Ventricles and sulci are within normal limits in size and configuration. Incidental punctate focus of calcification the left aspect of the pons. Minimal patchy hypoattenuation in supratentorial white matter is nonspecific but may reflect minor chronic microvascular ischemic changes. Vascular: There is atherosclerotic calcification at the skull base. Skull: Calvarium is unremarkable. Sinuses/Orbits: No acute finding. Other:  None. Review of the MIP images confirms the above findings CTA NECK Aortic arch: Great vessel origins are patent. Right carotid system: Patent. There is primarily noncalcified plaque at the ICA origin causing approximately 60% stenosis. Left carotid system: Patent. Minimal calcified plaque at the ICA origin without measurable stenosis. Vertebral arteries: Patent and codominant. Skeleton: Degenerative changes of the cervical spine. 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 There is moderate to marked canal stenosis. Other neck: No mass or adenopathy. Upper chest: No apical lung mass. Review of the MIP images confirms the above findings CTA HEAD Anterior circulation: Intracranial internal carotid arteries are patent with calcified plaque but no significant stenosis. Anterior and middle cerebral arteries are patent. Posterior circulation: Intracranial vertebral arteries, basilar artery, and posterior cerebral arteries are patent. Venous sinuses: Patent as allowed by contrast bolus timing. Review of the MIP images confirms  the above findings IMPRESSION: No acute intracranial abnormality. No large vessel occlusion. 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 Electronically Signed   By: Macy Mis M.D.   On: 05/22/2020 20:17   MR KNEE RIGHT WO CONTRAST  Result Date: 05/23/2020 CLINICAL DATA:  Anterior knee pain.  Recent knee injury 3 days ago EXAM: MRI OF THE RIGHT KNEE WITHOUT CONTRAST TECHNIQUE: Multiplanar, multisequence MR imaging of the knee was performed. No intravenous contrast was administered. COMPARISON:  X-ray 05/22/2020 FINDINGS: Technical note: Motion degraded exam. MENISCI Medial meniscus: Horizontal-oblique tear extending to the inferior articular surface of the mid body (series 6, image 14). Lateral meniscus:  Intact. LIGAMENTS Cruciates:  Intact ACL and PCL. Collaterals: MCL intact. IT band and biceps femoris attachments intact. Fibular collateral ligament  appears grossly intact, although it is femoral attachment is somewhat ill-defined, possibly reflecting a sprain. CARTILAGE Patellofemoral: Extensive full-thickness cartilage loss within the lateral aspect of the patellofemoral compartment. Medial:  No chondral defect. Lateral:  No chondral defect. Joint: Large knee joint effusion without fluid fluid level. There is edema within the superolateral aspect of Hoffa's fat. Popliteal Fossa:  No Baker cyst. Intact popliteus tendon. Extensor Mechanism:  Intact quadriceps tendon and patellar tendon. Bones: No focal marrow signal abnormality. No fracture or dislocation. Other: Nonspecific soft tissue edema at the anterolateral aspect of the knee. IMPRESSION: 1. Horizontal-oblique tear of the medial meniscus extending to the inferior articular surface of the mid body. 2. Extensive full-thickness cartilage loss within the lateral aspect of the patellofemoral compartment. 3. Possible sprain of the fibular collateral ligament. 4. Large knee joint effusion. 5. Edema within the superolateral aspect of Hoffa's fat pad, which can be seen in the setting of patellar tendon-lateral femoral condyle friction syndrome. Electronically Signed   By: Davina Poke D.O.   On: 05/23/2020 13:46   DG Knee Complete 4 Views Right  Result Date: 05/22/2020 CLINICAL DATA:  Right knee pain. EXAM: RIGHT KNEE - COMPLETE 4+ VIEW COMPARISON:  None. FINDINGS: No fracture or dislocation is noted. Moderate suprapatellar joint effusion is noted. Moderate narrowing of patellofemoral space is noted with osteophyte formation. Medial and lateral joint spaces are unremarkable. IMPRESSION: Moderate suprapatellar joint effusion. 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 No fracture or dislocation is noted. Electronically Signed   By: Marijo Conception M.D.   On: 05/22/2020 17:25    Scheduled Meds: . enoxaparin (LOVENOX) injection  40 mg Subcutaneous Q24H  . insulin aspart  0-15 Units  Subcutaneous TID WC  . insulin aspart  0-5 Units Subcutaneous QHS  . sodium chloride flush  3 mL Intravenous Q12H   Continuous Infusions:   LOS: 0 days   Marylu Lund, MD [facility] Pager On Amion  If 7PM-7AM, please contact night-coverage 05/23/2020, 2:57 PM

## 2020-05-23 NOTE — ED Notes (Signed)
PT placed on 3L of oxygen by nasal cannula due to pt dropping to 68% on room air.

## 2020-05-23 NOTE — CV Procedure (Signed)
Patient is refusing echocardiogram. The wsyncopal episode was caused by  knee pain, not the heart.  Brad Spencer [facility] 05/23/20 10:04

## 2020-05-24 ENCOUNTER — Other Ambulatory Visit (HOSPITAL_COMMUNITY): Payer: PPO

## 2020-05-24 DIAGNOSIS — E119 Type 2 diabetes mellitus without complications: Secondary | ICD-10-CM

## 2020-05-24 DIAGNOSIS — Z794 Long term (current) use of insulin: Secondary | ICD-10-CM

## 2020-05-24 LAB — GLUCOSE, CAPILLARY
Glucose-Capillary: 181 mg/dL — ABNORMAL HIGH (ref 70–99)
Glucose-Capillary: 232 mg/dL — ABNORMAL HIGH (ref 70–99)
Glucose-Capillary: 199 mg/dL — ABNORMAL HIGH (ref 70–99)
Glucose-Capillary: 217 mg/dL — ABNORMAL HIGH (ref 70–99)

## 2020-05-24 LAB — CBC
HCT: 39.8 % (ref 39.0–52.0)
Hemoglobin: 13.1 g/dL (ref 13.0–17.0)
MCH: 32 pg (ref 26.0–34.0)
MCHC: 32.9 g/dL (ref 30.0–36.0)
MCV: 97.3 fL (ref 80.0–100.0)
Platelets: 178 10*3/uL (ref 150–400)
RBC: 4.09 MIL/uL — ABNORMAL LOW (ref 4.22–5.81)
RDW: 13.2 % (ref 11.5–15.5)
WBC: 9.9 10*3/uL (ref 4.0–10.5)
nRBC: 0 % (ref 0.0–0.2)

## 2020-05-24 LAB — COMPREHENSIVE METABOLIC PANEL
ALT: 16 U/L (ref 0–44)
AST: 17 U/L (ref 15–41)
Albumin: 2.8 g/dL — ABNORMAL LOW (ref 3.5–5.0)
Alkaline Phosphatase: 41 U/L (ref 38–126)
Anion gap: 11 (ref 5–15)
BUN: 19 mg/dL (ref 8–23)
CO2: 25 mmol/L (ref 22–32)
Calcium: 8.4 mg/dL — ABNORMAL LOW (ref 8.9–10.3)
Chloride: 99 mmol/L (ref 98–111)
Creatinine, Ser: 1.45 mg/dL — ABNORMAL HIGH (ref 0.61–1.24)
GFR calc Af Amer: 57 mL/min — ABNORMAL LOW (ref >60)
GFR calc non Af Amer: 49 mL/min — ABNORMAL LOW (ref >60)
Glucose, Bld: 183 mg/dL — ABNORMAL HIGH (ref 70–99)
Potassium: 4 mmol/L (ref 3.5–5.1)
Sodium: 135 mmol/L (ref 135–145)
Total Bilirubin: 1.1 mg/dL (ref 0.3–1.2)
Total Protein: 6.5 g/dL (ref 6.5–8.1)

## 2020-05-24 LAB — SYNOVIAL CELL COUNT + DIFF, W/ CRYSTALS
Monocyte-Macrophage-Synovial Fluid: 2 % — ABNORMAL LOW (ref 50–90)
Neutrophil, Synovial: 98 % — ABNORMAL HIGH (ref 0–25)
WBC, Synovial: 32125 /mm3 — ABNORMAL HIGH (ref 0–200)

## 2020-05-24 MED ORDER — ADULT MULTIVITAMIN W/MINERALS CH
1.0000 | ORAL_TABLET | Freq: Every day | ORAL | Status: DC
  Administered 2020-05-24 – 2020-05-25 (×2): 1 via ORAL
  Filled 2020-05-24: qty 1

## 2020-05-24 MED ORDER — INSULIN ASPART PROT & ASPART (70-30 MIX) 100 UNIT/ML ~~LOC~~ SUSP
30.0000 [IU] | Freq: Two times a day (BID) | SUBCUTANEOUS | Status: DC
  Administered 2020-05-24 – 2020-05-25 (×2): 30 [IU] via SUBCUTANEOUS
  Filled 2020-05-24: qty 10

## 2020-05-24 MED ORDER — ENSURE MAX PROTEIN PO LIQD
11.0000 [oz_av] | Freq: Two times a day (BID) | ORAL | Status: DC
  Administered 2020-05-25: 11 [oz_av] via ORAL
  Filled 2020-05-24 (×3): qty 330

## 2020-05-24 MED ORDER — BUPIVACAINE HCL (PF) 0.5 % IJ SOLN
10.0000 mL | Freq: Once | INTRAMUSCULAR | Status: AC
  Administered 2020-05-24: 10 mL
  Filled 2020-05-24: qty 10

## 2020-05-24 MED ORDER — METHYLPREDNISOLONE ACETATE 80 MG/ML IJ SUSP
80.0000 mg | Freq: Once | INTRAMUSCULAR | Status: AC
  Administered 2020-05-24: 80 mg via INTRA_ARTICULAR
  Filled 2020-05-24: qty 1

## 2020-05-24 MED ORDER — EZETIMIBE 10 MG PO TABS
10.0000 mg | ORAL_TABLET | Freq: Every day | ORAL | Status: DC
  Administered 2020-05-24: 10 mg via ORAL
  Filled 2020-05-24: qty 1

## 2020-05-24 MED ORDER — SODIUM CHLORIDE 0.9 % IV SOLN
INTRAVENOUS | Status: DC
  Administered 2020-05-24: 125 mL/h via INTRAVENOUS

## 2020-05-24 NOTE — Progress Notes (Signed)
PROGRESS NOTE    Brad Spencer  RXV:[id number] DOB: 11-21-1951 DOA: 05/22/2020 PCP: Cher Nakai, MD    Brief Narrative:  69 y.o. male with medical history significant for diabetes and history of non-Hodgkin's lymphoma who presents to the emergency room following a suspected syncopal episode.  Patient had been in his usual state of health until 4 days ago when he sustained a fall while playing with his grandson sustaining injury to the right knee.  He states that he was able to ambulate on the knee until yesterday when he awoke and noticed increased pain and increased swelling.  Today while in the shower he states he made a bad move his knee started hurting more and the next thing he passed out.  He did not lose consciousness but felt like he was about to.  Upon recovering he had 2 episodes of nonbloody nonbilious episodes.  He denies abdominal pain denies diarrhea as well as fever or chills. On arrival of EMS he had another episode while being transferred into the stretcher and almost passed out. He did have some shaking according to EMS but not jerking movements and it only lasted a few seconds.  He reportedly bradycardia down to the 20s during the episode.  He denied chest pain or shortness of breath.  Patient's main complaint is the pain and swelling of the right knee. ED Course: On arrival patient was awake and oriented.  His work-up was significant for slightly elevated blood sugar of 245 with ketones in the urine but otherwise mostly unremarkable.  Troponin was 3, lipase 23, WBC slightly elevated at 11,000.  EKG showed a few VPCs but no acute ST-T wave changes.  Chest x-ray benign.  CT angio head and neck showed no LVO.  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  He also had an x-ray of the knee that showed moderate suprapatellar joint effusion with some degenerative changes.  Hospitalist consulted for admission.  Assessment & Plan:   Active Problems:   Syncope and collapse   Type 2  diabetes mellitus without complication (HCC)   Right knee pain   Fall at home, initial encounter   History of non-Hodgkin's lymphoma  Active Problems:   Likely vasovagal syncope triggered by intense pain -No further episodes of dizziness or syncope.  No event on telemetry.  Echo ordered 2 days ago.  Not sure why this not done yet.    Type 2 diabetes mellitus without complication (HCC) Hemoglobin A1c 7.3.  Takes Metformin and 70/30, 40 units twice daily.  Currently have hyperglycemia.  Will resume 70/30 at 30 units twice daily and continue SSI.    Right knee pain secondary to medial meniscus tear with large knee effusion s/p fall -He was seen by orthopedic yesterday.  Status post MRI which confirmed above findings.  Orthopedics did not recommend any arthrocentesis.  Patient was upset this morning and requested second opinion.  Orthopedics were called at the bedside.  Patient ended up having arthrocentesis with 55 mL of yellow-colored fluid which was nonbloody, however hemarthrosis was expected.  Fluid is sent for culture cell count.  Appreciate orthopedics help.  PT on board.    History of non-Hodgkin's lymphoma -Appears stable at this time  DVT prophylaxis: Lovenox subq Code Status: Full Family Communication: Pt in room, family at bedside  Status is: Inpatient  Status is: Inpatient  Remains inpatient appropriate because:Inpatient level of care appropriate due to severity of illness   Dispo: The patient is from: Home  Anticipated d/c is to: Home              Anticipated d/c date is: 1 day              Patient currently is not medically stable to d/c.   Consultants:   Orthopedic Surgery  Procedures:   Right knee arthrocentesis 05/24/2020  Antimicrobials: Anti-infectives (From admission, onward)   None      Subjective: Patient seen and examined.  Complains of 8 out of 10 pain in the right knee.  No other complaint.  Objective: Vitals:   05/23/20 2034  05/24/20 0052 05/24/20 0448 05/24/20 0935  BP: 136/74 123/71 122/81 (!) 148/94  Pulse: 96 81 88 98  Resp: 18 18 18 18   Temp: 98.4 F (36.9 C) 98.8 F (37.1 C) 98.2 F (36.8 C) 97.7 F (36.5 C)  TempSrc: Oral Oral Oral Oral  SpO2: 93% 93% 92% 95%  Weight:   114.1 kg   Height:        Intake/Output Summary (Last 24 hours) at 05/24/2020 1315 Last data filed at 05/24/2020 0900 Gross per 24 hour  Intake 300 ml  Output 301 ml  Net -1 ml   Filed Weights   05/22/20 1612 05/22/20 1749 05/24/20 0448  Weight: 110.7 kg 110.7 kg 114.1 kg    Examination:  General exam: Appears calm and comfortable  Respiratory system: Clear to auscultation. Respiratory effort normal. Cardiovascular system: S1 & S2 heard, RRR. No JVD, murmurs, rubs, gallops or clicks. No pedal edema. Gastrointestinal system: Abdomen is nondistended, soft and nontender. No organomegaly or masses felt. Normal bowel sounds heard. Central nervous system: Alert and oriented. No focal neurological deficits. Extremities: Right knee edematous.  Nontender.  No erythema. Skin: No rashes, lesions or ulcers.  Psychiatry: Judgement and insight appear normal. Mood & affect appropriate.   Data Reviewed: I have personally reviewed following labs and imaging studies  CBC: Recent Labs  Lab 05/22/20 1828 05/23/20 0347 05/24/20 0408  WBC 11.2* 10.8* 9.9  NEUTROABS 10.0*  --   --   HGB 14.7 14.1 13.1  HCT 45.0 43.0 39.8  MCV 98.7 97.9 97.3  PLT 166 185 650   Basic Metabolic Panel: Recent Labs  Lab 05/22/20 1828 05/23/20 0347 05/24/20 0408  NA 136 135 135  K 5.2* 4.6 4.0  CL 102 102 99  CO2 26 24 25   GLUCOSE 245* 189* 183*  BUN 12 11 19   CREATININE 1.18 1.25* 1.45*  CALCIUM 8.3* 8.4* 8.4*   GFR: Estimated Creatinine Clearance: 64.5 mL/min (A) (by C-G formula based on SCr of 1.45 mg/dL (H)). Liver Function Tests: Recent Labs  Lab 05/22/20 1828 05/24/20 0408  AST 20 17  ALT 21 16  ALKPHOS 49 41  BILITOT 1.0 1.1    PROT 6.7 6.5  ALBUMIN 3.3* 2.8*   Recent Labs  Lab 05/22/20 1828  LIPASE 23   No results for input(s): AMMONIA in the last 168 hours. Coagulation Profile: No results for input(s): INR, PROTIME in the last 168 hours. Cardiac Enzymes: No results for input(s): CKTOTAL, CKMB, CKMBINDEX, TROPONINI in the last 168 hours. BNP (last 3 results) No results for input(s): PROBNP in the last 8760 hours. HbA1C: Recent Labs    05/23/20 0347  HGBA1C 7.3*   CBG: Recent Labs  Lab 05/23/20 0801 05/23/20 1648 05/23/20 2037 05/24/20 0643 05/24/20 1120  GLUCAP 166* 172* 167* 181* 232*   Lipid Profile: No results for input(s): CHOL, HDL, LDLCALC, TRIG, CHOLHDL, LDLDIRECT in the  last 72 hours. Thyroid Function Tests: No results for input(s): TSH, T4TOTAL, FREET4, T3FREE, THYROIDAB in the last 72 hours. Anemia Panel: No results for input(s): VITAMINB12, FOLATE, FERRITIN, TIBC, IRON, RETICCTPCT in the last 72 hours. Sepsis Labs: No results for input(s): PROCALCITON, LATICACIDVEN in the last 168 hours.  Recent Results (from the past 240 hour(s))  SARS Coronavirus 2 by RT PCR (hospital order, performed in [facility] hospital lab) Nasopharyngeal Urine, Clean Catch     Status: None   Collection Time: 05/22/20  9:04 PM   Specimen: Urine, Clean Catch; Nasopharyngeal  Result Value Ref Range Status   SARS Coronavirus 2 NEGATIVE NEGATIVE Final    Comment: (NOTE) SARS-CoV-2 target nucleic acids are NOT DETECTED.  The SARS-CoV-2 RNA is generally detectable in upper and lower respiratory specimens during the acute phase of infection. The lowest concentration of SARS-CoV-2 viral copies this assay can detect is 250 copies / mL. A negative result does not preclude SARS-CoV-2 infection and should not be used as the sole basis for treatment or other patient management decisions.  A negative result may occur with improper specimen collection / handling, submission of specimen other than nasopharyngeal  swab, presence of viral mutation(s) within the areas targeted by this assay, and inadequate number of viral copies (<250 copies / mL). A negative result must be combined with clinical observations, patient history, and epidemiological information.  Fact Sheet for Patients:   StrictlyIdeas.no  Fact Sheet for Healthcare Providers: BankingDealers.co.za  This test is not yet approved or  cleared by the [place] FDA and has been authorized for detection and/or diagnosis of SARS-CoV-2 by FDA under an Emergency Use Authorization (EUA).  This EUA will remain in effect (meaning this test can be used) for the duration of the COVID-19 declaration under Section 564(b)(1) of the Act, 21 U.S.C. section 360bbb-3(b)(1), unless the authorization is terminated or revoked sooner.  Performed at [facility], [place] [address], [place], [place] [address]      Radiology Studies: EEG  Result Date: 05/23/2020 Lora Havens, MD     05/23/2020 10:09 AM Patient Name: Turrell Severt MRN: [id number] Epilepsy Attending: Lora Havens Referring Physician/Provider: Dr. Judd Gaudier Date: 05/22/2020 Duration: 24.06 minutes Patient history: 69 year old male who presented with 2 episodes of syncope.  EEG evaluate for seizures. Level of alertness: Awake AEDs during EEG study: None Technical aspects: This EEG study was done with scalp electrodes positioned according to the 10-20 International system of electrode placement. Electrical activity was acquired at a sampling rate of 500Hz  and reviewed with a high frequency filter of 70Hz  and a low frequency filter of 1Hz . EEG data were recorded continuously and digitally stored. Description: The posterior dominant rhythm consists of 8 Hz activity of moderate voltage (25-35 uV) seen predominantly in posterior head regions, symmetric and reactive to eye opening and eye closing.  Physiology photic driving was seen during  photic stimulation.  Hyperventilation was not performed.   IMPRESSION: This study is within normal limits. No seizures or epileptiform discharges were seen throughout the recording. Lora Havens   CT Angio Head W or Wo Contrast  Result Date: 05/22/2020 CLINICAL DATA:  Syncopal episode EXAM: CT ANGIOGRAPHY HEAD AND NECK TECHNIQUE: Multidetector CT imaging of the head and neck was performed using the standard protocol during bolus administration of intravenous contrast. Multiplanar CT image reconstructions and MIPs were obtained to evaluate the vascular anatomy. Carotid stenosis measurements (when applicable) are obtained utilizing NASCET criteria, using the distal internal  carotid diameter as the denominator. CONTRAST:  62mL OMNIPAQUE IOHEXOL 350 MG/ML SOLN COMPARISON:  None. FINDINGS: CT HEAD Brain: There is no acute intracranial hemorrhage, mass effect, or edema. Gray-white differentiation is preserved. There is no extra-axial fluid collection. Ventricles and sulci are within normal limits in size and configuration. Incidental punctate focus of calcification the left aspect of the pons. Minimal patchy hypoattenuation in supratentorial white matter is nonspecific but may reflect minor chronic microvascular ischemic changes. Vascular: There is atherosclerotic calcification at the skull base. Skull: Calvarium is unremarkable. Sinuses/Orbits: No acute finding. Other: None. Review of the MIP images confirms the above findings CTA NECK Aortic arch: Great vessel origins are patent. Right carotid system: Patent. There is primarily noncalcified plaque at the ICA origin causing approximately 60% stenosis. Left carotid system: Patent. Minimal calcified plaque at the ICA origin without measurable stenosis. Vertebral arteries: Patent and codominant. Skeleton: Degenerative changes of the cervical spine. 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 There is moderate to marked canal stenosis.  Other neck: No mass or adenopathy. Upper chest: No apical lung mass. Review of the MIP images confirms the above findings CTA HEAD Anterior circulation: Intracranial internal carotid arteries are patent with calcified plaque but no significant stenosis. Anterior and middle cerebral arteries are patent. Posterior circulation: Intracranial vertebral arteries, basilar artery, and posterior cerebral arteries are patent. Venous sinuses: Patent as allowed by contrast bolus timing. Review of the MIP images confirms the above findings IMPRESSION: No acute intracranial abnormality. No large vessel occlusion. 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 Electronically Signed   By: Macy Mis M.D.   On: 05/22/2020 20:17   DG Chest 2 View  Result Date: 05/22/2020 CLINICAL DATA:  Chest pain, shortness of breath. EXAM: CHEST - 2 VIEW COMPARISON:  August 20, 2014. FINDINGS: The heart size and mediastinal contours are within normal limits. No pneumothorax or pleural effusion is noted. Right lung is clear. Elevated left hemidiaphragm is noted with minimal left basilar subsegmental atelectasis. The visualized skeletal structures are unremarkable. IMPRESSION: Elevated left hemidiaphragm with minimal left basilar subsegmental atelectasis. Electronically Signed   By: Marijo Conception M.D.   On: 05/22/2020 17:23   CT Angio Neck W and/or Wo Contrast  Result Date: 05/22/2020 CLINICAL DATA:  Syncopal episode EXAM: CT ANGIOGRAPHY HEAD AND NECK TECHNIQUE: Multidetector CT imaging of the head and neck was performed using the standard protocol during bolus administration of intravenous contrast. Multiplanar CT image reconstructions and MIPs were obtained to evaluate the vascular anatomy. Carotid stenosis measurements (when applicable) are obtained utilizing NASCET criteria, using the distal internal carotid diameter as the denominator. CONTRAST:  50mL OMNIPAQUE IOHEXOL 350 MG/ML SOLN COMPARISON:  None. FINDINGS: CT HEAD  Brain: There is no acute intracranial hemorrhage, mass effect, or edema. Gray-white differentiation is preserved. There is no extra-axial fluid collection. Ventricles and sulci are within normal limits in size and configuration. Incidental punctate focus of calcification the left aspect of the pons. Minimal patchy hypoattenuation in supratentorial white matter is nonspecific but may reflect minor chronic microvascular ischemic changes. Vascular: There is atherosclerotic calcification at the skull base. Skull: Calvarium is unremarkable. Sinuses/Orbits: No acute finding. Other: None. Review of the MIP images confirms the above findings CTA NECK Aortic arch: Great vessel origins are patent. Right carotid system: Patent. There is primarily noncalcified plaque at the ICA origin causing approximately 60% stenosis. Left carotid system: Patent. Minimal calcified plaque at the ICA origin without measurable stenosis. Vertebral arteries:  Patent and codominant. Skeleton: Degenerative changes of the cervical spine. 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 There is moderate to marked canal stenosis. Other neck: No mass or adenopathy. Upper chest: No apical lung mass. Review of the MIP images confirms the above findings CTA HEAD Anterior circulation: Intracranial internal carotid arteries are patent with calcified plaque but no significant stenosis. Anterior and middle cerebral arteries are patent. Posterior circulation: Intracranial vertebral arteries, basilar artery, and posterior cerebral arteries are patent. Venous sinuses: Patent as allowed by contrast bolus timing. Review of the MIP images confirms the above findings IMPRESSION: No acute intracranial abnormality. No large vessel occlusion. 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 Electronically Signed   By: Macy Mis M.D.   On: 05/22/2020 20:17   MR KNEE RIGHT WO CONTRAST  Result Date: 05/23/2020 CLINICAL DATA:  Anterior  knee pain.  Recent knee injury 3 days ago EXAM: MRI OF THE RIGHT KNEE WITHOUT CONTRAST TECHNIQUE: Multiplanar, multisequence MR imaging of the knee was performed. No intravenous contrast was administered. COMPARISON:  X-ray 05/22/2020 FINDINGS: Technical note: Motion degraded exam. MENISCI Medial meniscus: Horizontal-oblique tear extending to the inferior articular surface of the mid body (series 6, image 14). Lateral meniscus:  Intact. LIGAMENTS Cruciates:  Intact ACL and PCL. Collaterals: MCL intact. IT band and biceps femoris attachments intact. Fibular collateral ligament appears grossly intact, although it is femoral attachment is somewhat ill-defined, possibly reflecting a sprain. CARTILAGE Patellofemoral: Extensive full-thickness cartilage loss within the lateral aspect of the patellofemoral compartment. Medial:  No chondral defect. Lateral:  No chondral defect. Joint: Large knee joint effusion without fluid fluid level. There is edema within the superolateral aspect of Hoffa's fat. Popliteal Fossa:  No Baker cyst. Intact popliteus tendon. Extensor Mechanism:  Intact quadriceps tendon and patellar tendon. Bones: No focal marrow signal abnormality. No fracture or dislocation. Other: Nonspecific soft tissue edema at the anterolateral aspect of the knee. IMPRESSION: 1. Horizontal-oblique tear of the medial meniscus extending to the inferior articular surface of the mid body. 2. Extensive full-thickness cartilage loss within the lateral aspect of the patellofemoral compartment. 3. Possible sprain of the fibular collateral ligament. 4. Large knee joint effusion. 5. Edema within the superolateral aspect of Hoffa's fat pad, which can be seen in the setting of patellar tendon-lateral femoral condyle friction syndrome. Electronically Signed   By: Davina Poke D.O.   On: 05/23/2020 13:46   DG Knee Complete 4 Views Right  Result Date: 05/22/2020 CLINICAL DATA:  Right knee pain. EXAM: RIGHT KNEE - COMPLETE 4+  VIEW COMPARISON:  None. FINDINGS: No fracture or dislocation is noted. Moderate suprapatellar joint effusion is noted. Moderate narrowing of patellofemoral space is noted with osteophyte formation. Medial and lateral joint spaces are unremarkable. IMPRESSION: Moderate suprapatellar joint effusion. 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 No fracture or dislocation is noted. Electronically Signed   By: Marijo Conception M.D.   On: 05/22/2020 17:25    Scheduled Meds: . enoxaparin (LOVENOX) injection  40 mg Subcutaneous Q24H  . insulin aspart  0-15 Units Subcutaneous TID WC  . insulin aspart  0-5 Units Subcutaneous QHS  . sodium chloride flush  3 mL Intravenous Q12H   Continuous Infusions: . sodium chloride 125 mL/hr (05/24/20 0918)     LOS: 1 day   Darliss Cheney, MD [facility] Pager On Amion  If 7PM-7AM, please contact night-coverage 05/24/2020, 1:15 PM

## 2020-05-24 NOTE — Progress Notes (Signed)
Initial Nutrition Assessment  DOCUMENTATION CODES:   Obesity unspecified  INTERVENTION:  Ensure Max po BID, each supplement provides 150 kcal and 30 grams of protein  MVI daily   NUTRITION DIAGNOSIS:   Inadequate oral intake related to poor appetite as evidenced by per patient/family report.    GOAL:   Patient will meet greater than or equal to 90% of their needs    MONITOR:   PO intake, Supplement acceptance, Labs, I & O's  REASON FOR ASSESSMENT:   Malnutrition Screening Tool    ASSESSMENT:   Pt presented with syncope and R knee pain 2/2 medial meniscus tear w/ large knee effusion s/p fall. PMH significant for DM and hx of non-Hodgkin's lymphoma.  6/23 R knee aspiration and injection  Pt endorses poor appetite since Saturday (6/19). Pt states that his poor appetite was likely due to knee pain, which he states has been greatly relieved by aspiration/injection.  Prior to Saturday, pt states he had a good appetite. Pt states that he had a career in [employer] and therefore does not eat 3 typical meals per day. Instead, he tends to snack throughout the day and then has a larger dinner. Pt denies any wt loss PTA. No wt history available in chart for review.   PO Intake: 50% x 2 recorded meals  Labs reviewed. CBGs 224-497 (Diabetes Coordinator following) Medications: Novolog  NUTRITION - FOCUSED PHYSICAL EXAM:    Most Recent Value  Orbital Region No depletion  Upper Arm Region No depletion  Thoracic and Lumbar Region No depletion  Buccal Region No depletion  Temple Region No depletion  Clavicle Bone Region No depletion  Clavicle and Acromion Bone Region No depletion  Scapular Bone Region No depletion  Dorsal Hand No depletion  Patellar Region Mild depletion  Anterior Thigh Region Mild depletion  Posterior Calf Region Mild depletion  Edema (RD Assessment) None  Hair Reviewed  Eyes Reviewed  Mouth Reviewed  Skin Reviewed  Nails Reviewed       Diet  Order:   Diet Order            Diet heart healthy/carb modified Room service appropriate? Yes; Fluid consistency: Thin  Diet effective now                 EDUCATION NEEDS:   No education needs have been identified at this time  Skin:  Skin Assessment: Reviewed RN Assessment  Last BM:  6/20  Height:   Ht Readings from Last 1 Encounters:  05/22/20 6' (1.829 m)    Weight:   Wt Readings from Last 1 Encounters:  05/24/20 114.1 kg    BMI:  Body mass index is 34.12 kg/m.  Estimated Nutritional Needs:   Kcal:  2150-2350  Protein:  115-130 grams  Fluid:  >2L/d    Larkin Ina, MS, RD, LDN RD pager number and weekend/on-call pager number located in [place].

## 2020-05-24 NOTE — Plan of Care (Signed)
  Problem: Activity: Goal: Risk for activity intolerance will decrease Outcome: Progressing   

## 2020-05-24 NOTE — Progress Notes (Addendum)
 Occupational Therapy Evaluation Patient Details Name: Brad Spencer MRN: [id number] DOB: 03-08-1951 Today's Date: 05/24/2020    History of Present Illness Pt is a 69 y/o male admitted secondary to syncopal episode, likely from intense pain in R knee. Ortho recommendations pending and pt for MRI of R knee. PMH includes DM, non-hodgekin lymphoma, and HTN.    Clinical Impression   PTA, pt completely Independent with all ADLs, IADLs and mobility without AD. Pt lives with wife in a multi-level home, able to live on main floor with only tub shower unit (walk in showers on other floors). Presently, pt with high level of R knee pain with limited evaluation completed due to pt vomiting. Pt's wife reports assisting pt in walking to bathroom with RW prior to OT eval and is comfortable assisting pt. Time spent educating pt and family on ways to maximize ability to mobilize and complete ADLs in the home, including tub/shower transfers with pt/family receptive of all education. Plan to further assess mobility during ADLs during next session. Recommend HHOT follow-up at discharge, RW, tub bench, and BSC. Will continue to follow acutely.     Follow Up Recommendations  Home health OT    Equipment Recommendations  Tub/shower bench;Other (comment);3 in 1 bedside commode (RW)    Recommendations for Other Services       Precautions / Restrictions Precautions Precautions: Fall Restrictions Weight Bearing Restrictions: Yes RLE Weight Bearing: Weight bearing as tolerated      Mobility Bed Mobility Overal bed mobility: Needs Assistance Bed Mobility: Supine to Sit;Sit to Supine     Supine to sit: Min assist Sit to supine: Min assist   General bed mobility comments: Assistance needed to advancement of R LE  Transfers Overall transfer level: Needs assistance               General transfer comment: unable to fully assess due to pt vomiting during evaluation    Balance Overall balance  assessment: No apparent balance deficits (not formally assessed)                                         ADL either performed or assessed with clinical judgement   ADL Overall ADL's : Needs assistance/impaired Eating/Feeding: Independent;Sitting   Grooming: Minimal assistance;Standing   Upper Body Bathing: Set up;Sitting   Lower Body Bathing: Moderate assistance;Sitting/lateral leans;Sit to/from stand   Upper Body Dressing : Set up;Sitting   Lower Body Dressing: Moderate assistance;Sitting/lateral leans;Sit to/from stand   Toilet Transfer: Minimal assistance;Stand-pivot Armed forces technical officer Details (indicate cue type and reason): Pt's wife in room and reports assisting pt to bathroom with RW without difficulty Toileting- Clothing Manipulation and Hygiene: Minimal assistance;Sitting/lateral lean;Sit to/from stand       Functional mobility during ADLs: Rolling walker;Minimal assistance;Min guard General ADL Comments: Pt with mod A at most required due to severity of pain and decreased abiliity to complete ROM/WB for LB ADLs     Vision Baseline Vision/History: No visual deficits Patient Visual Report: No change from baseline Vision Assessment?: No apparent visual deficits     Perception     Praxis      Pertinent Vitals/Pain Pain Assessment: 0-10 Pain Score: 8  Pain Location: R knee  Pain Descriptors / Indicators: Sharp;Guarding;Grimacing Pain Intervention(s): Limited activity within patient's tolerance;Monitored during session;Repositioned;Ice applied     Hand Dominance Right   Extremity/Trunk Assessment Upper Extremity Assessment Upper Extremity  Assessment: Generalized weakness   Lower Extremity Assessment Lower Extremity Assessment: Defer to PT evaluation   Cervical / Trunk Assessment Cervical / Trunk Assessment: Normal   Communication Communication Communication: No difficulties   Cognition Arousal/Alertness: Awake/alert Behavior During  Therapy: WFL for tasks assessed/performed Overall Cognitive Status: Within Functional Limits for tasks assessed                                     General Comments  Pt's wife present during session, provided education on DME that may be needed once discharged home including tub bench and recommendation to remove glass doors on shower to allow for tub bench placement.     Exercises     Shoulder Instructions      Home Living Family/patient expects to be discharged to:: Private residence Living Arrangements: Spouse/significant other Available Help at Discharge: Family Type of Home: House Home Access: Stairs to enter Technical  of Steps: 1 Entrance Stairs-Rails: None Home Layout: Able to live on main level with bedroom/bathroom;Multi-level     Bathroom Shower/Tub: Teacher, early years/pre: Standard                Prior Functioning/Environment Level of Independence: Independent        Comments: Independent with ADLs, IADLs and mobility without AD        OT Problem List: Decreased strength;Decreased activity tolerance;Impaired balance (sitting and/or standing);Pain;Decreased knowledge of use of DME or AE      OT Treatment/Interventions: Self-care/ADL training;Therapeutic exercise;Energy conservation;DME and/or AE instruction;Therapeutic activities;Patient/family education    OT Goals(Current goals can be found in the care plan section) Acute Rehab OT Goals Patient Stated Goal: to go home  OT Goal Formulation: With patient Time For Goal Achievement: 06/07/20 Potential to Achieve Goals: Good ADL Goals Pt Will Perform Grooming: with supervision;standing Pt Will Perform Lower Body Bathing: with min guard assist;sit to/from stand;sitting/lateral leans Pt Will Perform Lower Body Dressing: with min guard assist;with adaptive equipment;sitting/lateral leans;sit to/from stand Pt Will Transfer to Toilet: with supervision;ambulating;regular  height toilet Pt Will Perform Toileting - Clothing Manipulation and hygiene: with supervision;sit to/from stand;sitting/lateral leans  OT Frequency: Min 2X/week   Barriers to D/C:            Co-evaluation              AM-PAC OT "6 Clicks" Daily Activity     Outcome Measure Help from another person eating meals?: None Help from another person taking care of personal grooming?: A Little Help from another person toileting, which includes using toliet, bedpan, or urinal?: A Little Help from another person bathing (including washing, rinsing, drying)?: A Little Help from another person to put on and taking off regular upper body clothing?: A Little Help from another person to put on and taking off regular lower body clothing?: A Little 6 Click Score: 19   End of Session Nurse Communication: Other (comment) (pt vomiting)  Activity Tolerance: Treatment limited secondary to medical complications (Comment);Patient limited by pain (limited due to pt vomiting from pain) Patient left: in chair;with call bell/phone within reach;with family/visitor present  OT Visit Diagnosis: Unsteadiness on feet (R26.81);Muscle weakness (generalized) (M62.81);Pain Pain - Right/Left: Right Pain - part of body: Knee                Time: 1093-2355 OT Time Calculation (min): 19 min Charges:  OT General Charges $OT Visit: 1 Visit  OT Evaluation $OT Eval Moderate Complexity: 1 Mod  Layla Maw, OTR/L  Layla Maw 05/24/2020, 4:28 PM

## 2020-05-24 NOTE — Procedures (Signed)
Procedure: Right knee aspiration and injection  Indication: Right knee effusion(s)  Surgeon: Silvestre Gunner, PA-C  Assist: None  Anesthesia: Topical refrigerant  EBL: None  Complications: None  Findings: After risks/benefits explained patient desires to undergo procedure. Consent obtained and time out performed. The right knee was sterilely prepped and aspirated. 34ml cloudy yellow fluid obtained. As this was not a hemearthrosis as expected will send fluid for culture and cell count. 68ml 0.5% Marcaine and 80mg  depomedrol instilled. Pt had immediate relief with arthrocentesis. Pt tolerated the procedure well.    Lisette Abu, PA-C Orthopedic Surgery [phone]

## 2020-05-24 NOTE — Progress Notes (Signed)
Synovial aspirant sent to lab for analysis.  Patient states has "great relief" from right knee aspiration.  Ice applied.  Denied pain at this time.

## 2020-05-24 NOTE — Progress Notes (Signed)
Inpatient Diabetes Program Recommendations  AACE/ADA: New Consensus Statement on Inpatient Glycemic Control (2015)  Target Ranges:  Prepandial:   less than 140 mg/dL      Peak postprandial:   less than 180 mg/dL (1-2 hours)      Critically ill patients:  140 - 180 mg/dL   Lab Results  Component Value Date   GLUCAP 232 (H) 05/24/2020   HGBA1C 7.3 (H) 05/23/2020    Review of Glycemic Control Results for KHORI, ROSEVEAR (MRN [id number]) as of 05/24/2020 13:03  Ref. Range 05/23/2020 08:01 05/23/2020 16:48 05/23/2020 20:37 05/24/2020 06:43 05/24/2020 11:20  Glucose-Capillary Latest Ref Range: 70 - 99 mg/dL 166 (H) 172 (H) 167 (H) 181 (H) 232 (H)   Diabetes history:  DM2  Outpatient Diabetes medications:  70/30 40 units bid  Metformin 850 mg bid  Current orders for Inpatient glycemic control:  Novolog 0-15 units tid Novolog 0-5 qhs  Dep-Medrol 80 mg x 1  Inpatient Diabetes Program Recommendations:     If cbg's begin to increase please consider levemir 22 units bid  Will continue to follow while inpatient.  Thank you, Reche Dixon, RN, BSN Diabetes Coordinator Inpatient Diabetes Program [phone] (team pager from 8a-5p)

## 2020-05-25 ENCOUNTER — Inpatient Hospital Stay (HOSPITAL_COMMUNITY): Payer: PPO

## 2020-05-25 DIAGNOSIS — R55 Syncope and collapse: Secondary | ICD-10-CM

## 2020-05-25 LAB — CBC WITH DIFFERENTIAL/PLATELET
Abs Immature Granulocytes: 0.05 10*3/uL (ref 0.00–0.07)
Basophils Absolute: 0 10*3/uL (ref 0.0–0.1)
Basophils Relative: 0 %
Eosinophils Absolute: 0 10*3/uL (ref 0.0–0.5)
Eosinophils Relative: 0 %
HCT: 40.5 % (ref 39.0–52.0)
Hemoglobin: 13.5 g/dL (ref 13.0–17.0)
Immature Granulocytes: 0 %
Lymphocytes Relative: 7 %
Lymphs Abs: 0.8 10*3/uL (ref 0.7–4.0)
MCH: 32.6 pg (ref 26.0–34.0)
MCHC: 33.3 g/dL (ref 30.0–36.0)
MCV: 97.8 fL (ref 80.0–100.0)
Monocytes Absolute: 0.7 10*3/uL (ref 0.1–1.0)
Monocytes Relative: 6 %
Neutro Abs: 10.2 10*3/uL — ABNORMAL HIGH (ref 1.7–7.7)
Neutrophils Relative %: 87 %
Platelets: 211 10*3/uL (ref 150–400)
RBC: 4.14 MIL/uL — ABNORMAL LOW (ref 4.22–5.81)
RDW: 12.9 % (ref 11.5–15.5)
WBC: 11.7 10*3/uL — ABNORMAL HIGH (ref 4.0–10.5)
nRBC: 0 % (ref 0.0–0.2)

## 2020-05-25 LAB — BASIC METABOLIC PANEL
Anion gap: 9 (ref 5–15)
BUN: 14 mg/dL (ref 8–23)
CO2: 24 mmol/L (ref 22–32)
Calcium: 8.3 mg/dL — ABNORMAL LOW (ref 8.9–10.3)
Chloride: 103 mmol/L (ref 98–111)
Creatinine, Ser: 1.2 mg/dL (ref 0.61–1.24)
GFR calc Af Amer: >60 mL/min (ref >60)
GFR calc non Af Amer: >60 mL/min (ref >60)
Glucose, Bld: 216 mg/dL — ABNORMAL HIGH (ref 70–99)
Potassium: 4.9 mmol/L (ref 3.5–5.1)
Sodium: 136 mmol/L (ref 135–145)

## 2020-05-25 LAB — ECHOCARDIOGRAM COMPLETE
Height: 72 in
Weight: 4057.17 oz

## 2020-05-25 LAB — GLUCOSE, CAPILLARY
Glucose-Capillary: 215 mg/dL — ABNORMAL HIGH (ref 70–99)
Glucose-Capillary: 218 mg/dL — ABNORMAL HIGH (ref 70–99)

## 2020-05-25 NOTE — Discharge Instructions (Signed)
     Syncope Syncope is when you pass out (faint) for a short time. It is caused by a sudden decrease in blood flow to the brain. Signs that you may be about to pass out include:  Feeling dizzy or light-headed.  Feeling sick to your stomach (nauseous).  Seeing all white or all black.  Having cold, clammy skin. If you pass out, get help right away. Call your local emergency services (911 in the [place] Do not drive yourself to the hospital. Follow these instructions at home: Watch for any changes in your symptoms. Take these actions to stay safe and help with your symptoms: Lifestyle  Do not drive, use machinery, or play sports until your doctor says it is okay.  Do not drink alcohol.  Do not use any products that contain nicotine or tobacco, such as cigarettes and e-cigarettes. If you need help quitting, ask your doctor.  Drink enough fluid to keep your pee (urine) pale yellow. General instructions  Take over-the-counter and prescription medicines only as told by your doctor.  If you are taking blood pressure or heart medicine, sit up and stand up slowly. Spend a few minutes getting ready to sit and then stand. This can help you feel less dizzy.  Have someone stay with you until you feel stable.  If you start to feel like you might pass out, lie down right away and raise (elevate) your feet above the level of your heart. Breathe deeply and steadily. Wait until all of the symptoms are gone.  Keep all follow-up visits as told by your doctor. This is important. Get help right away if:  You have a very bad headache.  You pass out once or more than once.  You have pain in your chest, belly, or back.  You have a very fast or uneven heartbeat (palpitations).  It hurts to breathe.  You are bleeding from your mouth or your bottom (rectum).  You have black or tarry poop (stool).  You have jerky movements that you cannot control (seizure).  You are confused.  You have  trouble walking.  You are very weak.  You have vision problems. These symptoms may be an emergency. Do not wait to see if the symptoms will go away. Get medical help right away. Call your local emergency services (911 in the [place] Do not drive yourself to the hospital. Summary  Syncope is when you pass out (faint) for a short time. It is caused by a sudden decrease in blood flow to the brain.  Signs that you may be about to faint include feeling dizzy, light-headed, or sick to your stomach, seeing all white or all black, or having cold, clammy skin.  If you start to feel like you might pass out, lie down right away and raise (elevate) your feet above the level of your heart. Breathe deeply and steadily. Wait until all of the symptoms are gone. This information is not intended to replace advice given to you by your health care provider. Make sure you discuss any questions you have with your health care provider. Document Revised: 12/31/2017 Document Reviewed: 12/31/2017 [employer] Patient Education  2020 [employer]  

## 2020-05-25 NOTE — Discharge Summary (Addendum)
Physician Discharge Summary  [name] XWR:[id number] DOB: 1950/12/03 DOA: 05/22/2020  PCP: Cher Nakai, MD  Admit date: 05/22/2020 Discharge date: 05/25/2020  Admitted From: Home Disposition: Home  Recommendations for Outpatient Follow-up:  1. Follow up with PCP in 1-2 weeks 2. Follow-up with orthopedics as outpatient.  Please touch base with them regarding the timing. 3. Please obtain BMP/CBC in one week 4. Please follow up with your PCP on the following pending results: Unresulted Labs (From admission, onward) Comment          Start     Ordered   05/25/20 0500  CBC with Differential/Platelet  Daily,   R      05/24/20 1323   05/25/20 1914  Basic metabolic panel  Daily,   R      05/24/20 1323           Home Health: Yes Equipment/Devices: Knee brace and rolling walker  Discharge Condition: Stable CODE STATUS: Full code Diet recommendation: Cardiac  Subjective: Seen and examined.  Feels much better.  Pain completely resolved.  Wants to go home.  Brief/Interim Summary: 69 y.o.malewith medical history significant fordiabetes and history ofnon-Hodgkin's lymphoma presented to emergency room following asuspectedsyncopal episode.Patient hadbeen in his usual state of health until 4days ago whenhe sustained a fall while playing with his grandson sustaining injury to the right knee. He was able to ambulate on the knee until day before admission when he awoke and noticed increased pain and increased swelling.  On the day of admission, while in the shower he stateshe made a bad movehis knee started hurting more and the next thing he passed out. He did not lose consciousness but felt like he was about to. Upon recovering he had 2episodes of nonbloody nonbilious episodes. On arrival of EMS he had another episode while being transferred into the stretcher and almost passed out. He did have some shaking according to EMS but not jerking movements and it only lasted a few  seconds. He reportedly bradycardia down to the 20s during the episode. He denied chest pain or shortness of breath. Patient's main complaintis the pain and swelling of the right knee. On arrival to ED, patient was awake and oriented.His work-up was significant for slightly elevated blood sugar of 245 with ketones in the urine but otherwise mostly unremarkable. Troponin was 3, lipase 23, WBC slightly elevated at 11,000. EKG showed a few VPCs but no acute ST-T wave changes. Chest x-ray benign. CT angio head and neck showed no LVO. 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 He also had an x-ray of the knee that showed moderate suprapatellar joint effusion with some degenerative changes.  Patient was admitted under hospitalist service.  Orthopedics was consulted.  MRI of right knee showed medial meniscus tear.  Extensive full-thickness cartilage loss within the lateral aspect of the patellofemoral compartment and large knee joint effusion.  Despite of this, initial plan from orthopedics was to continue conservative management with knee brace however patient insisted upon doing arthrocentesis and after that, orthopedics did arthrocentesis and retrieved 55 mL of synovial fluid which showed monosodium urate crystals and 33,000 of white cells.  In my opinion, this raised suspicion for possible septic arthritis based on the high white blood cell so I discussed with Legrand Como Jeffrey/PA with orthopedics who opined against any infection and recommended the patient can be discharged without any antibiotics.  Patient is feeling much better today.  He was evaluated by PT OT who recommended home health which has  been ordered for him.  Transthoracic echo was obtained which was unremarkable for any valvular heart disease.  Normal ejection fraction.  No event on telemetry.  Based on all the work-up, his syncope/near syncope was likely secondary to pain.  Patient is being discharged in stable condition.  Of note,  patient did not have any bradycardia episode during this hospitalization.  Discharge Diagnoses:  Active Problems:   Syncope and collapse   Type 2 diabetes mellitus without complication (HCC)   Right knee pain   Fall at home, initial encounter   History of non-Hodgkin's lymphoma    Discharge Instructions   Allergies as of 05/25/2020      Reactions   Chlorhexidine Dermatitis   Ranitidine Hcl Nausea And Vomiting   Rosiglitazone Nausea And Vomiting   Latex Rash      Medication List    TAKE these medications   acetaminophen 500 MG tablet Commonly known as: TYLENOL Take 1,000 mg by mouth every 6 (six) hours as needed (pain).   ezetimibe 10 MG tablet Commonly known as: ZETIA Take 10 mg by mouth daily with supper.   insulin NPH-regular Human (70-30) 100 UNIT/ML injection Inject 40 Units into the skin 2 (two) times daily before a meal.   metFORMIN 850 MG tablet Commonly known as: GLUCOPHAGE Take 850 mg by mouth 2 (two) times daily with a meal.       Follow-up Information    Hiram Gash, MD. Schedule an appointment as soon as possible for a visit in 2 week(s).   Specialty: Orthopedic Surgery Contact information: [address] [address] [phone]        Cher Nakai, MD Follow up in 1 week(s).   Specialty: Internal Medicine Contact information: [address] [place] [address] [phone]              Allergies  Allergen Reactions   Chlorhexidine Dermatitis   Ranitidine Hcl Nausea And Vomiting   Rosiglitazone Nausea And Vomiting   Latex Rash    Consultations: Orthopedics   Procedures/Studies: EEG  Result Date: 05/23/2020 Lora Havens, MD     05/23/2020 10:09 AM Patient Name: Brad Spencer MRN: [id number] Epilepsy Attending: Lora Havens Referring Physician/Provider: Dr. Judd Gaudier Date: 05/22/2020 Duration: 24.06 minutes Patient history: 69 year old male who presented with 2 episodes of  syncope.  EEG evaluate for seizures. Level of alertness: Awake AEDs during EEG study: None Technical aspects: This EEG study was done with scalp electrodes positioned according to the 10-20 International system of electrode placement. Electrical activity was acquired at a sampling rate of 500Hz  and reviewed with a high frequency filter of 70Hz  and a low frequency filter of 1Hz . EEG data were recorded continuously and digitally stored. Description: The posterior dominant rhythm consists of 8 Hz activity of moderate voltage (25-35 uV) seen predominantly in posterior head regions, symmetric and reactive to eye opening and eye closing.  Physiology photic driving was seen during photic stimulation.  Hyperventilation was not performed.   IMPRESSION: This study is within normal limits. No seizures or epileptiform discharges were seen throughout the recording. Lora Havens   CT Angio Head W or Wo Contrast  Result Date: 05/22/2020 CLINICAL DATA:  Syncopal episode EXAM: CT ANGIOGRAPHY HEAD AND NECK TECHNIQUE: Multidetector CT imaging of the head and neck was performed using the standard protocol during bolus administration of intravenous contrast. Multiplanar CT image reconstructions and MIPs were obtained to evaluate the vascular anatomy. Carotid  stenosis measurements (when applicable) are obtained utilizing NASCET criteria, using the distal internal carotid diameter as the denominator. CONTRAST:  92mL OMNIPAQUE IOHEXOL 350 MG/ML SOLN COMPARISON:  None. FINDINGS: CT HEAD Brain: There is no acute intracranial hemorrhage, mass effect, or edema. Gray-white differentiation is preserved. There is no extra-axial fluid collection. Ventricles and sulci are within normal limits in size and configuration. Incidental punctate focus of calcification the left aspect of the pons. Minimal patchy hypoattenuation in supratentorial white matter is nonspecific but may reflect minor chronic microvascular ischemic changes. Vascular:  There is atherosclerotic calcification at the skull base. Skull: Calvarium is unremarkable. Sinuses/Orbits: No acute finding. Other: None. Review of the MIP images confirms the above findings CTA NECK Aortic arch: Great vessel origins are patent. Right carotid system: Patent. There is primarily noncalcified plaque at the ICA origin causing approximately 60% stenosis. Left carotid system: Patent. Minimal calcified plaque at the ICA origin without measurable stenosis. Vertebral arteries: Patent and codominant. Skeleton: Degenerative changes of the cervical spine. 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 There is moderate to marked canal stenosis. Other neck: No mass or adenopathy. Upper chest: No apical lung mass. Review of the MIP images confirms the above findings CTA HEAD Anterior circulation: Intracranial internal carotid arteries are patent with calcified plaque but no significant stenosis. Anterior and middle cerebral arteries are patent. Posterior circulation: Intracranial vertebral arteries, basilar artery, and posterior cerebral arteries are patent. Venous sinuses: Patent as allowed by contrast bolus timing. Review of the MIP images confirms the above findings IMPRESSION: No acute intracranial abnormality. No large vessel occlusion. 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 Electronically Signed   By: Macy Mis M.D.   On: 05/22/2020 20:17   DG Chest 2 View  Result Date: 05/22/2020 CLINICAL DATA:  Chest pain, shortness of breath. EXAM: CHEST - 2 VIEW COMPARISON:  August 20, 2014. FINDINGS: The heart size and mediastinal contours are within normal limits. No pneumothorax or pleural effusion is noted. Right lung is clear. Elevated left hemidiaphragm is noted with minimal left basilar subsegmental atelectasis. The visualized skeletal structures are unremarkable. IMPRESSION: Elevated left hemidiaphragm with minimal left basilar subsegmental atelectasis.  Electronically Signed   By: Marijo Conception M.D.   On: 05/22/2020 17:23   CT Angio Neck W and/or Wo Contrast  Result Date: 05/22/2020 CLINICAL DATA:  Syncopal episode EXAM: CT ANGIOGRAPHY HEAD AND NECK TECHNIQUE: Multidetector CT imaging of the head and neck was performed using the standard protocol during bolus administration of intravenous contrast. Multiplanar CT image reconstructions and MIPs were obtained to evaluate the vascular anatomy. Carotid stenosis measurements (when applicable) are obtained utilizing NASCET criteria, using the distal internal carotid diameter as the denominator. CONTRAST:  50mL OMNIPAQUE IOHEXOL 350 MG/ML SOLN COMPARISON:  None. FINDINGS: CT HEAD Brain: There is no acute intracranial hemorrhage, mass effect, or edema. Gray-white differentiation is preserved. There is no extra-axial fluid collection. Ventricles and sulci are within normal limits in size and configuration. Incidental punctate focus of calcification the left aspect of the pons. Minimal patchy hypoattenuation in supratentorial white matter is nonspecific but may reflect minor chronic microvascular ischemic changes. Vascular: There is atherosclerotic calcification at the skull base. Skull: Calvarium is unremarkable. Sinuses/Orbits: No acute finding. Other: None. Review of the MIP images confirms the above findings CTA NECK Aortic arch: Great vessel origins are patent. Right carotid system: Patent. There is primarily noncalcified plaque at the ICA origin causing approximately 60% stenosis. Left carotid system:  Patent. Minimal calcified plaque at the ICA origin without measurable stenosis. Vertebral arteries: Patent and codominant. Skeleton: Degenerative changes of the cervical spine. 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 There is moderate to marked canal stenosis. Other neck: No mass or adenopathy. Upper chest: No apical lung mass. Review of the MIP images confirms the above findings  CTA HEAD Anterior circulation: Intracranial internal carotid arteries are patent with calcified plaque but no significant stenosis. Anterior and middle cerebral arteries are patent. Posterior circulation: Intracranial vertebral arteries, basilar artery, and posterior cerebral arteries are patent. Venous sinuses: Patent as allowed by contrast bolus timing. Review of the MIP images confirms the above findings IMPRESSION: No acute intracranial abnormality. No large vessel occlusion. 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 Electronically Signed   By: Macy Mis M.D.   On: 05/22/2020 20:17   MR KNEE RIGHT WO CONTRAST  Result Date: 05/23/2020 CLINICAL DATA:  Anterior knee pain.  Recent knee injury 3 days ago EXAM: MRI OF THE RIGHT KNEE WITHOUT CONTRAST TECHNIQUE: Multiplanar, multisequence MR imaging of the knee was performed. No intravenous contrast was administered. COMPARISON:  X-ray 05/22/2020 FINDINGS: Technical note: Motion degraded exam. MENISCI Medial meniscus: Horizontal-oblique tear extending to the inferior articular surface of the mid body (series 6, image 14). Lateral meniscus:  Intact. LIGAMENTS Cruciates:  Intact ACL and PCL. Collaterals: MCL intact. IT band and biceps femoris attachments intact. Fibular collateral ligament appears grossly intact, although it is femoral attachment is somewhat ill-defined, possibly reflecting a sprain. CARTILAGE Patellofemoral: Extensive full-thickness cartilage loss within the lateral aspect of the patellofemoral compartment. Medial:  No chondral defect. Lateral:  No chondral defect. Joint: Large knee joint effusion without fluid fluid level. There is edema within the superolateral aspect of Hoffa's fat. Popliteal Fossa:  No Baker cyst. Intact popliteus tendon. Extensor Mechanism:  Intact quadriceps tendon and patellar tendon. Bones: No focal marrow signal abnormality. No fracture or dislocation. Other: Nonspecific soft tissue edema at the anterolateral  aspect of the knee. IMPRESSION: 1. Horizontal-oblique tear of the medial meniscus extending to the inferior articular surface of the mid body. 2. Extensive full-thickness cartilage loss within the lateral aspect of the patellofemoral compartment. 3. Possible sprain of the fibular collateral ligament. 4. Large knee joint effusion. 5. Edema within the superolateral aspect of Hoffa's fat pad, which can be seen in the setting of patellar tendon-lateral femoral condyle friction syndrome. Electronically Signed   By: Davina Poke D.O.   On: 05/23/2020 13:46   DG Knee Complete 4 Views Right  Result Date: 05/22/2020 CLINICAL DATA:  Right knee pain. EXAM: RIGHT KNEE - COMPLETE 4+ VIEW COMPARISON:  None. FINDINGS: No fracture or dislocation is noted. Moderate suprapatellar joint effusion is noted. Moderate narrowing of patellofemoral space is noted with osteophyte formation. Medial and lateral joint spaces are unremarkable. IMPRESSION: Moderate suprapatellar joint effusion. 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 No fracture or dislocation is noted. Electronically Signed   By: Marijo Conception M.D.   On: 05/22/2020 17:25      Discharge Exam: Vitals:   05/25/20 0510 05/25/20 0947  BP: 131/85 (!) 144/82  Pulse: 65 67  Resp: 19 18  Temp: 97.6 F (36.4 C)   SpO2: 95% 94%   Vitals:   05/24/20 2137 05/25/20 0500 05/25/20 0510 05/25/20 0947  BP: 130/75  131/85 (!) 144/82  Pulse: 84  65 67  Resp: 18  19 18   Temp: 98.7 F (37.1 C)  97.6  F (36.4 C)   TempSrc: Oral  Oral   SpO2: 96%  95% 94%  Weight: 114.1 kg 115 kg    Height:        General: Pt is alert, awake, not in acute distress Cardiovascular: RRR, S1/S2 +, no rubs, no gallops Respiratory: CTA bilaterally, no wheezing, no rhonchi Abdominal: Soft, NT, ND, bowel sounds + Extremities: no edema, no cyanosis    The results of significant diagnostics from this hospitalization (including imaging, microbiology, ancillary and  laboratory) are listed below for reference.     Microbiology: Recent Results (from the past 240 hour(s))  SARS Coronavirus 2 by RT PCR (hospital order, performed in [facility] hospital lab) Nasopharyngeal Urine, Clean Catch     Status: None   Collection Time: 05/22/20  9:04 PM   Specimen: Urine, Clean Catch; Nasopharyngeal  Result Value Ref Range Status   SARS Coronavirus 2 NEGATIVE NEGATIVE Final    Comment: (NOTE) SARS-CoV-2 target nucleic acids are NOT DETECTED.  The SARS-CoV-2 RNA is generally detectable in upper and lower respiratory specimens during the acute phase of infection. The lowest concentration of SARS-CoV-2 viral copies this assay can detect is 250 copies / mL. A negative result does not preclude SARS-CoV-2 infection and should not be used as the sole basis for treatment or other patient management decisions.  A negative result may occur with improper specimen collection / handling, submission of specimen other than nasopharyngeal swab, presence of viral mutation(s) within the areas targeted by this assay, and inadequate number of viral copies (<250 copies / mL). A negative result must be combined with clinical observations, patient history, and epidemiological information.  Fact Sheet for Patients:   StrictlyIdeas.no  Fact Sheet for Healthcare Providers: BankingDealers.co.za  This test is not yet approved or  cleared by the [place] FDA and has been authorized for detection and/or diagnosis of SARS-CoV-2 by FDA under an Emergency Use Authorization (EUA).  This EUA will remain in effect (meaning this test can be used) for the duration of the COVID-19 declaration under Section 564(b)(1) of the Act, 21 U.S.C. section 360bbb-3(b)(1), unless the authorization is terminated or revoked sooner.  Performed at [facility], [place] [address] [place], [place] [address]   Body fluid culture     Status: None  (Preliminary result)   Collection Time: 05/24/20 10:06 AM   Specimen: Synovium; Body Fluid  Result Value Ref Range Status   Specimen Description SYNOVIAL  Final   Special Requests SYNOVIAL RIGHT KNEE SPEC A  Final   Gram Stain PENDING  Incomplete   Culture   Final    NO GROWTH < 24 HOURS Performed at [facility], [address], [place], [place] [address]    Report Status PENDING  Incomplete     Labs: BNP (last 3 results) No results for input(s): BNP in the last 8760 hours. Basic Metabolic Panel: Recent Labs  Lab 05/22/20 1828 05/23/20 0347 05/24/20 0408 05/25/20 0352  NA 136 135 135 136  K 5.2* 4.6 4.0 4.9  CL 102 102 99 103  CO2 26 24 25 24   GLUCOSE 245* 189* 183* 216*  BUN 12 11 19 14   CREATININE 1.18 1.25* 1.45* 1.20  CALCIUM 8.3* 8.4* 8.4* 8.3*   Liver Function Tests: Recent Labs  Lab 05/22/20 1828 05/24/20 0408  AST 20 17  ALT 21 16  ALKPHOS 49 41  BILITOT 1.0 1.1  PROT 6.7 6.5  ALBUMIN 3.3* 2.8*   Recent Labs  Lab 05/22/20 1828  LIPASE 23   No results for input(s): AMMONIA in the last 168 hours. CBC: Recent Labs  Lab 05/22/20 1828 05/23/20 0347 05/24/20 0408 05/25/20 0352  WBC 11.2* 10.8* 9.9 11.7*  NEUTROABS 10.0*  --   --  10.2*  HGB 14.7 14.1 13.1 13.5  HCT 45.0 43.0 39.8 40.5  MCV 98.7 97.9 97.3 97.8  PLT 166 185 178 211   Cardiac Enzymes: No results for input(s): CKTOTAL, CKMB, CKMBINDEX, TROPONINI in the last 168 hours. BNP: Invalid input(s): POCBNP CBG: Recent Labs  Lab 05/24/20 0643 05/24/20 1120 05/24/20 1647 05/24/20 2137 05/25/20 0645  GLUCAP 181* 232* 199* 217* 215*   D-Dimer No results for input(s): DDIMER in the last 72 hours. Hgb A1c Recent Labs    05/23/20 0347  HGBA1C 7.3*   Lipid Profile No results for input(s): CHOL, HDL, LDLCALC, TRIG, CHOLHDL, LDLDIRECT in the last 72 hours. Thyroid function studies No results for input(s): TSH, T4TOTAL, T3FREE, THYROIDAB in the last 72 hours.  Invalid  input(s): FREET3 Anemia work up No results for input(s): VITAMINB12, FOLATE, FERRITIN, TIBC, IRON, RETICCTPCT in the last 72 hours. Urinalysis    Component Value Date/Time   COLORURINE YELLOW 05/22/2020 2104   APPEARANCEUR CLEAR 05/22/2020 2104   LABSPEC 1.015 05/22/2020 2104   PHURINE 6.0 05/22/2020 2104   GLUCOSEU >=500 (A) 05/22/2020 2104   HGBUR NEGATIVE 05/22/2020 2104   BILIRUBINUR NEGATIVE 05/22/2020 2104   KETONESUR 20 (A) 05/22/2020 2104   PROTEINUR >=300 (A) 05/22/2020 2104   NITRITE NEGATIVE 05/22/2020 2104   LEUKOCYTESUR NEGATIVE 05/22/2020 2104   Sepsis Labs Invalid input(s): PROCALCITONIN,  WBC,  LACTICIDVEN Microbiology Recent Results (from the past 240 hour(s))  SARS Coronavirus 2 by RT PCR (hospital order, performed in [facility] hospital lab) Nasopharyngeal Urine, Clean Catch     Status: None   Collection Time: 05/22/20  9:04 PM   Specimen: Urine, Clean Catch; Nasopharyngeal  Result Value Ref Range Status   SARS Coronavirus 2 NEGATIVE NEGATIVE Final    Comment: (NOTE) SARS-CoV-2 target nucleic acids are NOT DETECTED.  The SARS-CoV-2 RNA is generally detectable in upper and lower respiratory specimens during the acute phase of infection. The lowest concentration of SARS-CoV-2 viral copies this assay can detect is 250 copies / mL. A negative result does not preclude SARS-CoV-2 infection and should not be used as the sole basis for treatment or other patient management decisions.  A negative result may occur with improper specimen collection / handling, submission of specimen other than nasopharyngeal swab, presence of viral mutation(s) within the areas targeted by this assay, and inadequate number of viral copies (<250 copies / mL). A negative result must be combined with clinical observations, patient history, and epidemiological information.  Fact Sheet for Patients:   StrictlyIdeas.no  Fact Sheet for Healthcare  Providers: BankingDealers.co.za  This test is not yet approved or  cleared by the [place] FDA and has been authorized for detection and/or diagnosis of SARS-CoV-2 by FDA under an Emergency Use Authorization (EUA).  This EUA will remain in effect (meaning this test can be used) for the duration of the COVID-19 declaration under Section 564(b)(1) of the Act, 21 U.S.C. section 360bbb-3(b)(1), unless the authorization is terminated or revoked sooner.  Performed at [facility], [place] [address], [place],  [address]   Body fluid culture     Status: None (Preliminary result)   Collection Time: 05/24/20 10:06 AM   Specimen: Synovium; Body Fluid  Result  Value Ref Range Status   Specimen Description SYNOVIAL  Final   Special Requests SYNOVIAL RIGHT KNEE SPEC A  Final   Gram Stain PENDING  Incomplete   Culture   Final    NO GROWTH < 24 HOURS Performed at [facility], [place] [address], [place], [place] [address]    Report Status PENDING  Incomplete     Time coordinating discharge: Over 30 minutes  SIGNED:   Darliss Cheney, MD  [facility] 05/25/2020, 10:19 AM  If 7PM-7AM, please contact night-coverage [email or web address]

## 2020-05-25 NOTE — Progress Notes (Signed)
  Echocardiogram 2D Echocardiogram has been performed.  Brad Spencer 05/25/2020, 9:00 AM

## 2020-05-25 NOTE — TOC Initial Note (Addendum)
Transition of Care [facility]) - Initial/Assessment Note    Patient Details  Name: Brad Spencer MRN: [id number] Date of Birth: 05/25/1951  Transition of Care [facility]) CM/SW Contact:    Bartholomew Crews, RN Phone Number: [phone] 05/25/2020, 2:06 PM  Clinical Narrative:                  Advised by nursing of patient transition home today, but needed outpatient PT and OT. Nursing assessed patient choice for outpatient rehab center.   Outpatient rehab referral for PT and OT sent [facility]. NCM spoke with patient at bedside to advise of referral sent and information was placed on AVS. Patient also recommended RW - referral sent to [employer] to deliver to room. DME order placed.   Preferred pharmacy verified in [place]. Spouse to provide transportation home this afternoon. [facility] referral for outpatient follow up.   No further TOC needs identified.   Expected Discharge Plan: OP Rehab Barriers to Discharge: No Barriers Identified   Patient Goals and CMS Choice Patient states their goals for this hospitalization and ongoing recovery are:: return home with spouse CMS Medicare.gov Compare Post Acute Care list provided to:: Patient Choice offered to / list presented to : Patient  Expected Discharge Plan and Services Expected Discharge Plan: OP Rehab In-house Referral: [facility] Discharge Planning Services: CM Consult Post Acute Care Choice: NA Living arrangements for the past 2 months: Single Family Home Expected Discharge Date: 05/25/20               DME Arranged: Gilford Rile DME Agency: [employer] Date DME Agency Contacted: 05/25/20 Time DME Agency Contacted: [phone] Representative spoke with at DME Agency: [place]: NA [place] Agency: NA        Prior Living Arrangements/Services Living arrangements for the past 2 months: [place] with:: Self, Spouse Patient language and need for interpreter reviewed:: Yes Do you feel safe going back to the place where you  live?: Yes      Need for Family Participation in Patient Care: Yes (Comment) Care giver support system in place?: Yes (comment)   Criminal Activity/Legal Involvement Pertinent to Current Situation/Hospitalization: No - Comment as needed  Activities of Daily Living Home Assistive Devices/Equipment: None ADL Screening (condition at time of admission) Patient's cognitive ability adequate to safely complete daily activities?: Yes Is the patient deaf or have difficulty hearing?: No Does the patient have difficulty seeing, even when wearing glasses/contacts?: No Does the patient have difficulty concentrating, remembering, or making decisions?: No Patient able to express need for assistance with ADLs?: No Does the patient have difficulty dressing or bathing?: Yes Independently performs ADLs?: No Communication: Independent Dressing (OT): Needs assistance Is this a change from baseline?: Change from baseline, expected to last <3days Grooming: Independent Feeding: Independent Bathing: Needs assistance Is this a change from baseline?: Change from baseline, expected to last <3 days Toileting: Needs assistance Is this a change from baseline?: Change from baseline, expected to last <3 days In/Out Bed: Needs assistance Is this a change from baseline?: Change from baseline, expected to last <3 days Walks in Home: Needs assistance Is this a change from baseline?: Change from baseline, expected to last <3 days Does the patient have difficulty walking or climbing stairs?: Yes Weakness of Legs: Both Weakness of Arms/Hands: None  Permission Sought/Granted                  Emotional Assessment Appearance:: Appears stated age Attitude/Demeanor/Rapport: Engaged Affect (typically observed): Accepting Orientation: :  Oriented to Self, Oriented to  Time, Oriented to Place, Oriented to Situation Alcohol / Substance Use: Not Applicable Psych Involvement: No (comment)  Admission diagnosis:  Syncope  and collapse [R55] Effusion of right knee [M25.461] Acute pain of right knee [M25.561] Syncope, unspecified syncope type [R55] Patient Active Problem List   Diagnosis Date Noted  . Syncope and collapse 05/22/2020  . Type 2 diabetes mellitus without complication ([place]) 12/45/8099  . Right knee pain 05/22/2020  . Fall at home, initial encounter 05/22/2020  . History of non-Hodgkin's lymphoma 05/22/2020   PCP:  Cher Nakai, MD Pharmacy:   [place], [place] [address] [place] [address] Phone: [phone] Fax: [place], [place] - [place] [address] [place] [address] Phone: [phone] Fax: [phone]     Social Determinants of Health (SDOH) Interventions    Readmission Risk Interventions No flowsheet data found.

## 2020-05-25 NOTE — Progress Notes (Signed)
DISCHARGE NOTE HOME [place] Surgeon to be discharged Home per MD order. Discussed prescriptions and follow up appointments with the patient. Prescriptions given to patient; medication list explained in detail. Patient verbalized understanding.  Skin clean, dry and intact without evidence of skin break down, no evidence of skin tears noted. IV catheter discontinued intact. Site without signs and symptoms of complications. Dressing and pressure applied. Pt denies pain at the site currently. No complaints noted.  Patient free of lines, drains, and wounds.   An After Visit Summary (AVS) was printed and given to the patient. Patient escorted via wheelchair, and discharged home via private auto.  Aneta Mins BSN, RN3

## 2020-05-25 NOTE — Consult Note (Signed)
   [facility] Inpatient Consult   05/25/2020  Brad Spencer Dec 15, 1950 [id number]  Insurance Plan:  [employer] PPO    Thank you for this referral.  Unfortunately, our [place] management team will not be able to assist you with this patient. However, The [employer] UM team is noted follow for needs as appropriate for post hospital. Also, patient's primary care provider is listed for the Transition of Care follow up. Thank you.  For questions, please contact:  Natividad Brood, RN BSN [place] [facility] Liaison  [phone] business mobile phone Toll free office [phone]  Fax number: [phone] [email or web address]

## 2020-05-25 NOTE — Progress Notes (Signed)
Physical Therapy Treatment Patient Details Name: Brad Spencer MRN: [id number] DOB: 12-07-1950 Today's Date: 05/25/2020    History of Present Illness Pt is a 69 y/o male admitted secondary to syncopal episode, likely from intense pain in R knee. Ortho recommendations pending and pt for MRI of R knee. PMH includes DM, non-hodgekin lymphoma, and HTN.     PT Comments    Pt making excellent progress overall towards achieving his current functional mobility goals. He tolerated gait training with hinged bledsoe brace donned to R LE and use of RW with supervision for safety. Pt also demonstrating some general LE strengthening therex that he has been performing on his own. Pt would continue to benefit from skilled physical therapy services at this time while admitted and after d/c to address the below listed limitations in order to improve overall safety and independence with functional mobility.    Follow Up Recommendations  Outpatient PT     Equipment Recommendations  Rolling walker with 5" wheels    Recommendations for Other Services       Precautions / Restrictions Precautions Precautions: Fall Restrictions Weight Bearing Restrictions: Yes RLE Weight Bearing: Weight bearing as tolerated    Mobility  Bed Mobility Overal bed mobility: Needs Assistance Bed Mobility: Sit to Supine       Sit to supine: Supervision   General bed mobility comments: pt seated EOB upon arrival, no physical assistance needed to return to supine  Transfers Overall transfer level: Needs assistance Equipment used: Rolling walker (2 wheeled) Transfers: Sit to/from Stand Sit to Stand: Supervision         General transfer comment: supervision for safety; bledsoe hinged brace donned prior to standing  Ambulation/Gait Ambulation/Gait assistance: Supervision Gait Distance (Feet): 20 Feet Assistive device: Rolling walker (2 wheeled) Gait Pattern/deviations: Step-through pattern;Decreased step length  - right;Decreased step length - left;Decreased stride length Gait velocity: slightly decreased   General Gait Details: with hinged bledsoe brace donned to R knee, supervision for safety with use of RW; no LOB or instability   Stairs             Wheelchair Mobility    Modified Rankin (Stroke Patients Only)       Balance Overall balance assessment: Needs assistance Sitting-balance support: Feet supported Sitting balance-Leahy Scale: Good     Standing balance support: During functional activity;No upper extremity supported Standing balance-Leahy Scale: Fair                              Cognition Arousal/Alertness: Awake/alert Behavior During Therapy: WFL for tasks assessed/performed Overall Cognitive Status: Within Functional Limits for tasks assessed                                        Exercises General Exercises - Lower Extremity Ankle Circles/Pumps: AROM;Right;Seated Long Arc Quad: AROM;Strengthening;Right;Seated Hip Flexion/Marching: AROM;Strengthening;Right;Seated    General Comments        Pertinent Vitals/Pain Pain Assessment: Faces Faces Pain Scale: No hurt    Home Living                      Prior Function            PT Goals (current goals can now be found in the care plan section) Acute Rehab PT Goals PT Goal Formulation: With patient Time For Goal Achievement:  06/06/20 Potential to Achieve Goals: Good Progress towards PT goals: Progressing toward goals    Frequency    Min 3X/week      PT Plan Current plan remains appropriate    Co-evaluation              AM-PAC PT "6 Clicks" Mobility   Outcome Measure  Help needed turning from your back to your side while in a flat bed without using bedrails?: None Help needed moving from lying on your back to sitting on the side of a flat bed without using bedrails?: None Help needed moving to and from a bed to a chair (including a wheelchair)?:  None Help needed standing up from a chair using your arms (e.g., wheelchair or bedside chair)?: None Help needed to walk in hospital room?: None Help needed climbing 3-5 steps with a railing? : A Little 6 Click Score: 23    End of Session   Activity Tolerance: Patient tolerated treatment well Patient left: in bed;with call bell/phone within reach Nurse Communication: Mobility status PT Visit Diagnosis: Other abnormalities of gait and mobility (R26.89)     Time: 1023-1040 PT Time Calculation (min) (ACUTE ONLY): 17 min  Charges:  $Gait Training: 8-22 mins                     Anastasio Champion, DPT  Acute Rehabilitation Services Pager [phone] Office [place] 05/25/2020, 11:39 AM

## 2020-05-27 LAB — BODY FLUID CULTURE
Culture: NO GROWTH
Gram Stain: NONE SEEN

## 2020-05-30 DIAGNOSIS — C8590 Non-Hodgkin lymphoma, unspecified, unspecified site: Secondary | ICD-10-CM | POA: Diagnosis not present

## 2020-05-30 DIAGNOSIS — Z6833 Body mass index (BMI) 33.0-33.9, adult: Secondary | ICD-10-CM | POA: Diagnosis not present

## 2020-05-30 DIAGNOSIS — M159 Polyosteoarthritis, unspecified: Secondary | ICD-10-CM | POA: Diagnosis not present

## 2020-05-30 DIAGNOSIS — M25461 Effusion, right knee: Secondary | ICD-10-CM | POA: Diagnosis not present

## 2020-05-30 DIAGNOSIS — E785 Hyperlipidemia, unspecified: Secondary | ICD-10-CM | POA: Diagnosis not present

## 2020-05-30 DIAGNOSIS — E669 Obesity, unspecified: Secondary | ICD-10-CM | POA: Diagnosis not present

## 2020-05-30 DIAGNOSIS — I10 Essential (primary) hypertension: Secondary | ICD-10-CM | POA: Diagnosis not present

## 2020-05-30 DIAGNOSIS — R0600 Dyspnea, unspecified: Secondary | ICD-10-CM | POA: Diagnosis not present

## 2020-05-30 DIAGNOSIS — K5909 Other constipation: Secondary | ICD-10-CM | POA: Diagnosis not present

## 2020-05-30 DIAGNOSIS — E118 Type 2 diabetes mellitus with unspecified complications: Secondary | ICD-10-CM | POA: Diagnosis not present

## 2020-06-08 DIAGNOSIS — E669 Obesity, unspecified: Secondary | ICD-10-CM | POA: Diagnosis not present

## 2020-06-08 DIAGNOSIS — K5909 Other constipation: Secondary | ICD-10-CM | POA: Diagnosis not present

## 2020-06-08 DIAGNOSIS — Z6832 Body mass index (BMI) 32.0-32.9, adult: Secondary | ICD-10-CM | POA: Diagnosis not present

## 2020-06-08 DIAGNOSIS — R0600 Dyspnea, unspecified: Secondary | ICD-10-CM | POA: Diagnosis not present

## 2020-06-08 DIAGNOSIS — I10 Essential (primary) hypertension: Secondary | ICD-10-CM | POA: Diagnosis not present

## 2020-06-08 DIAGNOSIS — E118 Type 2 diabetes mellitus with unspecified complications: Secondary | ICD-10-CM | POA: Diagnosis not present

## 2020-06-08 DIAGNOSIS — M159 Polyosteoarthritis, unspecified: Secondary | ICD-10-CM | POA: Diagnosis not present

## 2020-06-08 DIAGNOSIS — E785 Hyperlipidemia, unspecified: Secondary | ICD-10-CM | POA: Diagnosis not present

## 2020-06-08 DIAGNOSIS — C8590 Non-Hodgkin lymphoma, unspecified, unspecified site: Secondary | ICD-10-CM | POA: Diagnosis not present

## 2020-06-30 DIAGNOSIS — E785 Hyperlipidemia, unspecified: Secondary | ICD-10-CM | POA: Diagnosis not present

## 2020-06-30 DIAGNOSIS — I10 Essential (primary) hypertension: Secondary | ICD-10-CM | POA: Diagnosis not present

## 2020-06-30 DIAGNOSIS — C8590 Non-Hodgkin lymphoma, unspecified, unspecified site: Secondary | ICD-10-CM | POA: Diagnosis not present

## 2020-06-30 DIAGNOSIS — M25561 Pain in right knee: Secondary | ICD-10-CM | POA: Diagnosis not present

## 2020-06-30 DIAGNOSIS — Z6832 Body mass index (BMI) 32.0-32.9, adult: Secondary | ICD-10-CM | POA: Diagnosis not present

## 2020-06-30 DIAGNOSIS — E118 Type 2 diabetes mellitus with unspecified complications: Secondary | ICD-10-CM | POA: Diagnosis not present

## 2020-06-30 DIAGNOSIS — R0600 Dyspnea, unspecified: Secondary | ICD-10-CM | POA: Diagnosis not present

## 2020-06-30 DIAGNOSIS — E669 Obesity, unspecified: Secondary | ICD-10-CM | POA: Diagnosis not present

## 2020-06-30 DIAGNOSIS — K5909 Other constipation: Secondary | ICD-10-CM | POA: Diagnosis not present

## 2020-06-30 DIAGNOSIS — M159 Polyosteoarthritis, unspecified: Secondary | ICD-10-CM | POA: Diagnosis not present

## 2020-07-04 DIAGNOSIS — I10 Essential (primary) hypertension: Secondary | ICD-10-CM | POA: Diagnosis not present

## 2020-07-04 DIAGNOSIS — Z6833 Body mass index (BMI) 33.0-33.9, adult: Secondary | ICD-10-CM | POA: Diagnosis not present

## 2020-07-04 DIAGNOSIS — M25561 Pain in right knee: Secondary | ICD-10-CM | POA: Diagnosis not present

## 2020-07-04 DIAGNOSIS — E119 Type 2 diabetes mellitus without complications: Secondary | ICD-10-CM | POA: Diagnosis not present

## 2020-07-06 DIAGNOSIS — E118 Type 2 diabetes mellitus with unspecified complications: Secondary | ICD-10-CM | POA: Diagnosis not present

## 2020-07-06 DIAGNOSIS — K5909 Other constipation: Secondary | ICD-10-CM | POA: Diagnosis not present

## 2020-07-06 DIAGNOSIS — M25561 Pain in right knee: Secondary | ICD-10-CM | POA: Diagnosis not present

## 2020-07-06 DIAGNOSIS — C8590 Non-Hodgkin lymphoma, unspecified, unspecified site: Secondary | ICD-10-CM | POA: Diagnosis not present

## 2020-07-06 DIAGNOSIS — E785 Hyperlipidemia, unspecified: Secondary | ICD-10-CM | POA: Diagnosis not present

## 2020-07-06 DIAGNOSIS — Z6833 Body mass index (BMI) 33.0-33.9, adult: Secondary | ICD-10-CM | POA: Diagnosis not present

## 2020-07-06 DIAGNOSIS — E669 Obesity, unspecified: Secondary | ICD-10-CM | POA: Diagnosis not present

## 2020-07-06 DIAGNOSIS — M159 Polyosteoarthritis, unspecified: Secondary | ICD-10-CM | POA: Diagnosis not present

## 2020-07-06 DIAGNOSIS — I10 Essential (primary) hypertension: Secondary | ICD-10-CM | POA: Diagnosis not present

## 2020-07-20 DIAGNOSIS — K5909 Other constipation: Secondary | ICD-10-CM | POA: Diagnosis not present

## 2020-07-20 DIAGNOSIS — E669 Obesity, unspecified: Secondary | ICD-10-CM | POA: Diagnosis not present

## 2020-07-20 DIAGNOSIS — Z6833 Body mass index (BMI) 33.0-33.9, adult: Secondary | ICD-10-CM | POA: Diagnosis not present

## 2020-07-20 DIAGNOSIS — M25561 Pain in right knee: Secondary | ICD-10-CM | POA: Diagnosis not present

## 2020-07-20 DIAGNOSIS — E118 Type 2 diabetes mellitus with unspecified complications: Secondary | ICD-10-CM | POA: Diagnosis not present

## 2020-07-20 DIAGNOSIS — R0600 Dyspnea, unspecified: Secondary | ICD-10-CM | POA: Diagnosis not present

## 2020-07-20 DIAGNOSIS — E785 Hyperlipidemia, unspecified: Secondary | ICD-10-CM | POA: Diagnosis not present

## 2020-07-20 DIAGNOSIS — M159 Polyosteoarthritis, unspecified: Secondary | ICD-10-CM | POA: Diagnosis not present

## 2020-07-20 DIAGNOSIS — C8590 Non-Hodgkin lymphoma, unspecified, unspecified site: Secondary | ICD-10-CM | POA: Diagnosis not present

## 2020-07-25 DIAGNOSIS — Z888 Allergy status to other drugs, medicaments and biological substances status: Secondary | ICD-10-CM | POA: Diagnosis not present

## 2020-07-25 DIAGNOSIS — E119 Type 2 diabetes mellitus without complications: Secondary | ICD-10-CM | POA: Diagnosis not present

## 2020-07-25 DIAGNOSIS — Z79899 Other long term (current) drug therapy: Secondary | ICD-10-CM | POA: Diagnosis not present

## 2020-07-25 DIAGNOSIS — C8330 Diffuse large B-cell lymphoma, unspecified site: Secondary | ICD-10-CM | POA: Diagnosis not present

## 2020-07-25 DIAGNOSIS — Z794 Long term (current) use of insulin: Secondary | ICD-10-CM | POA: Diagnosis not present

## 2020-07-25 DIAGNOSIS — I10 Essential (primary) hypertension: Secondary | ICD-10-CM | POA: Diagnosis not present

## 2020-08-17 DIAGNOSIS — I10 Essential (primary) hypertension: Secondary | ICD-10-CM | POA: Diagnosis not present

## 2020-08-17 DIAGNOSIS — E118 Type 2 diabetes mellitus with unspecified complications: Secondary | ICD-10-CM | POA: Diagnosis not present

## 2020-08-17 DIAGNOSIS — C8590 Non-Hodgkin lymphoma, unspecified, unspecified site: Secondary | ICD-10-CM | POA: Diagnosis not present

## 2020-08-17 DIAGNOSIS — R0600 Dyspnea, unspecified: Secondary | ICD-10-CM | POA: Diagnosis not present

## 2020-08-17 DIAGNOSIS — E785 Hyperlipidemia, unspecified: Secondary | ICD-10-CM | POA: Diagnosis not present

## 2020-08-17 DIAGNOSIS — Z6832 Body mass index (BMI) 32.0-32.9, adult: Secondary | ICD-10-CM | POA: Diagnosis not present

## 2020-08-17 DIAGNOSIS — Z1331 Encounter for screening for depression: Secondary | ICD-10-CM | POA: Diagnosis not present

## 2020-08-17 DIAGNOSIS — M159 Polyosteoarthritis, unspecified: Secondary | ICD-10-CM | POA: Diagnosis not present

## 2020-08-17 DIAGNOSIS — K5909 Other constipation: Secondary | ICD-10-CM | POA: Diagnosis not present

## 2020-08-17 DIAGNOSIS — Z9181 History of falling: Secondary | ICD-10-CM | POA: Diagnosis not present

## 2020-08-17 DIAGNOSIS — E669 Obesity, unspecified: Secondary | ICD-10-CM | POA: Diagnosis not present

## 2020-09-14 DIAGNOSIS — K5909 Other constipation: Secondary | ICD-10-CM | POA: Diagnosis not present

## 2020-09-14 DIAGNOSIS — E669 Obesity, unspecified: Secondary | ICD-10-CM | POA: Diagnosis not present

## 2020-09-14 DIAGNOSIS — Z23 Encounter for immunization: Secondary | ICD-10-CM | POA: Diagnosis not present

## 2020-09-14 DIAGNOSIS — R0600 Dyspnea, unspecified: Secondary | ICD-10-CM | POA: Diagnosis not present

## 2020-09-14 DIAGNOSIS — Z6833 Body mass index (BMI) 33.0-33.9, adult: Secondary | ICD-10-CM | POA: Diagnosis not present

## 2020-09-14 DIAGNOSIS — M159 Polyosteoarthritis, unspecified: Secondary | ICD-10-CM | POA: Diagnosis not present

## 2020-09-14 DIAGNOSIS — E785 Hyperlipidemia, unspecified: Secondary | ICD-10-CM | POA: Diagnosis not present

## 2020-09-14 DIAGNOSIS — E118 Type 2 diabetes mellitus with unspecified complications: Secondary | ICD-10-CM | POA: Diagnosis not present

## 2020-09-14 DIAGNOSIS — I10 Essential (primary) hypertension: Secondary | ICD-10-CM | POA: Diagnosis not present

## 2020-09-14 DIAGNOSIS — C8590 Non-Hodgkin lymphoma, unspecified, unspecified site: Secondary | ICD-10-CM | POA: Diagnosis not present

## 2020-10-12 DIAGNOSIS — M159 Polyosteoarthritis, unspecified: Secondary | ICD-10-CM | POA: Diagnosis not present

## 2020-10-12 DIAGNOSIS — R0600 Dyspnea, unspecified: Secondary | ICD-10-CM | POA: Diagnosis not present

## 2020-10-12 DIAGNOSIS — Z6833 Body mass index (BMI) 33.0-33.9, adult: Secondary | ICD-10-CM | POA: Diagnosis not present

## 2020-10-12 DIAGNOSIS — Z23 Encounter for immunization: Secondary | ICD-10-CM | POA: Diagnosis not present

## 2020-10-12 DIAGNOSIS — K5909 Other constipation: Secondary | ICD-10-CM | POA: Diagnosis not present

## 2020-10-12 DIAGNOSIS — I10 Essential (primary) hypertension: Secondary | ICD-10-CM | POA: Diagnosis not present

## 2020-10-12 DIAGNOSIS — C8590 Non-Hodgkin lymphoma, unspecified, unspecified site: Secondary | ICD-10-CM | POA: Diagnosis not present

## 2020-10-12 DIAGNOSIS — E785 Hyperlipidemia, unspecified: Secondary | ICD-10-CM | POA: Diagnosis not present

## 2020-10-12 DIAGNOSIS — E669 Obesity, unspecified: Secondary | ICD-10-CM | POA: Diagnosis not present

## 2020-10-12 DIAGNOSIS — E118 Type 2 diabetes mellitus with unspecified complications: Secondary | ICD-10-CM | POA: Diagnosis not present

## 2020-10-31 DIAGNOSIS — L570 Actinic keratosis: Secondary | ICD-10-CM | POA: Diagnosis not present

## 2020-11-10 DIAGNOSIS — E785 Hyperlipidemia, unspecified: Secondary | ICD-10-CM | POA: Diagnosis not present

## 2020-11-10 DIAGNOSIS — K5909 Other constipation: Secondary | ICD-10-CM | POA: Diagnosis not present

## 2020-11-10 DIAGNOSIS — R809 Proteinuria, unspecified: Secondary | ICD-10-CM | POA: Diagnosis not present

## 2020-11-10 DIAGNOSIS — Z6833 Body mass index (BMI) 33.0-33.9, adult: Secondary | ICD-10-CM | POA: Diagnosis not present

## 2020-11-10 DIAGNOSIS — M159 Polyosteoarthritis, unspecified: Secondary | ICD-10-CM | POA: Diagnosis not present

## 2020-11-10 DIAGNOSIS — E669 Obesity, unspecified: Secondary | ICD-10-CM | POA: Diagnosis not present

## 2020-11-10 DIAGNOSIS — R0600 Dyspnea, unspecified: Secondary | ICD-10-CM | POA: Diagnosis not present

## 2020-11-10 DIAGNOSIS — I10 Essential (primary) hypertension: Secondary | ICD-10-CM | POA: Diagnosis not present

## 2020-11-10 DIAGNOSIS — E1129 Type 2 diabetes mellitus with other diabetic kidney complication: Secondary | ICD-10-CM | POA: Diagnosis not present

## 2020-11-10 DIAGNOSIS — C8590 Non-Hodgkin lymphoma, unspecified, unspecified site: Secondary | ICD-10-CM | POA: Diagnosis not present

## 2021-01-11 DIAGNOSIS — M159 Polyosteoarthritis, unspecified: Secondary | ICD-10-CM | POA: Diagnosis not present

## 2021-01-11 DIAGNOSIS — K5909 Other constipation: Secondary | ICD-10-CM | POA: Diagnosis not present

## 2021-01-11 DIAGNOSIS — Z6833 Body mass index (BMI) 33.0-33.9, adult: Secondary | ICD-10-CM | POA: Diagnosis not present

## 2021-01-11 DIAGNOSIS — J208 Acute bronchitis due to other specified organisms: Secondary | ICD-10-CM | POA: Diagnosis not present

## 2021-01-11 DIAGNOSIS — C8590 Non-Hodgkin lymphoma, unspecified, unspecified site: Secondary | ICD-10-CM | POA: Diagnosis not present

## 2021-01-11 DIAGNOSIS — I10 Essential (primary) hypertension: Secondary | ICD-10-CM | POA: Diagnosis not present

## 2021-01-11 DIAGNOSIS — R809 Proteinuria, unspecified: Secondary | ICD-10-CM | POA: Diagnosis not present

## 2021-01-11 DIAGNOSIS — B9689 Other specified bacterial agents as the cause of diseases classified elsewhere: Secondary | ICD-10-CM | POA: Diagnosis not present

## 2021-01-11 DIAGNOSIS — M542 Cervicalgia: Secondary | ICD-10-CM | POA: Diagnosis not present

## 2021-01-11 DIAGNOSIS — E1129 Type 2 diabetes mellitus with other diabetic kidney complication: Secondary | ICD-10-CM | POA: Diagnosis not present

## 2021-01-11 DIAGNOSIS — E785 Hyperlipidemia, unspecified: Secondary | ICD-10-CM | POA: Diagnosis not present

## 2021-01-11 DIAGNOSIS — K5900 Constipation, unspecified: Secondary | ICD-10-CM | POA: Diagnosis not present

## 2021-01-15 IMAGING — CT CT ANGIO HEAD
1 of 11 series · 5 of 33 positions shown · IV contrast (omnipaque)
Comparison: None.

CLINICAL DATA: Syncopal episode

EXAM:
CT ANGIOGRAPHY HEAD AND NECK
TECHNIQUE: Multidetector CT imaging of the head and neck was performed using
the standard protocol during bolus administration of intravenous
contrast. Multiplanar CT image reconstructions and MIPs were
obtained to evaluate the vascular anatomy. Carotid stenosis
measurements (when applicable) are obtained utilizing NASCET
criteria, using the distal internal carotid diameter as the
denominator.
CONTRAST:  75mL OMNIPAQUE IOHEXOL 350 MG/ML SOLN

[Series 13: cta neck axial · axial · 0.39mm/px · z∈[-288,-52]mm · 5 of 355 slices shown]
[im 60/355  soft-tissue]
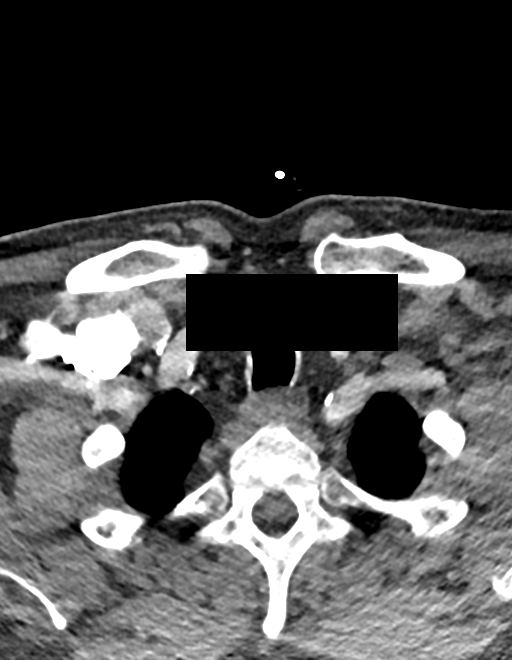
[im 119/355  bone]
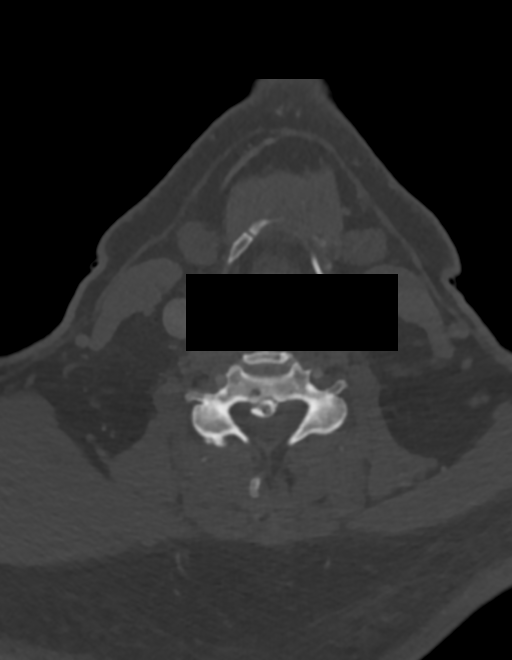
[im 178/355  soft-tissue]
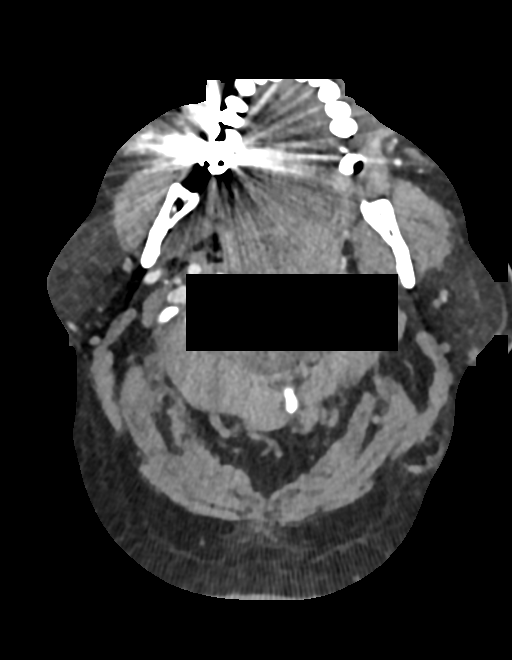
[im 237/355  bone]
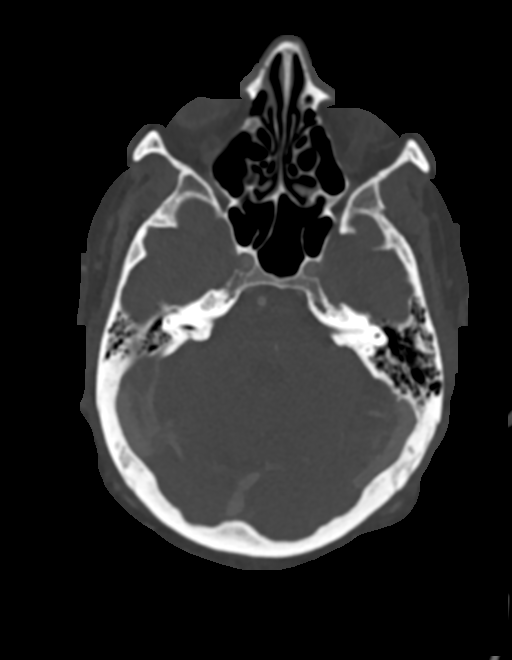
[im 296/355  soft-tissue]
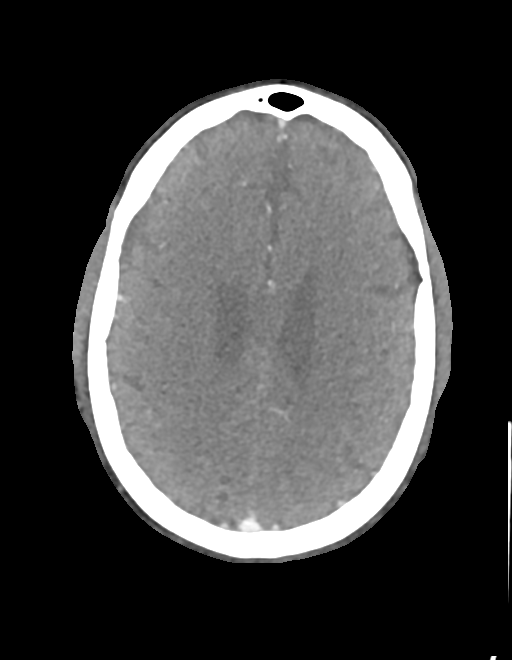

[5 of 33 positions shown; findings below may reference images not displayed]

FINDINGS: CT HEAD

Brain: There is no acute intracranial hemorrhage, mass effect, or
edema. Gray-white differentiation is preserved. There is no
extra-axial fluid collection. Ventricles and sulci are within normal
limits in size and configuration. Incidental punctate focus of
calcification the left aspect of the pons. Minimal patchy
hypoattenuation in supratentorial white matter is nonspecific but
may reflect minor chronic microvascular ischemic changes.

Vascular: There is atherosclerotic calcification at the skull base.

Skull: Calvarium is unremarkable.

Sinuses/Orbits: No acute finding.

Other: None.

Review of the MIP images confirms the above findings

CTA NECK

Aortic arch: Great vessel origins are patent.

Right carotid system: Patent. There is primarily noncalcified plaque
at the ICA origin causing approximately 60% stenosis.

Left carotid system: Patent. Minimal calcified plaque at the ICA
origin without measurable stenosis.

Vertebral arteries: Patent and codominant.

Skeleton: Degenerative changes of the cervical spine. There is
ossification of the posterior longitudinal ligament at C4 and C5.
There is moderate to marked canal stenosis.

Other neck: No mass or adenopathy.

Upper chest: No apical lung mass.

Review of the MIP images confirms the above findings

CTA HEAD

Anterior circulation: Intracranial internal carotid arteries are
patent with calcified plaque but no significant stenosis. Anterior
and middle cerebral arteries are patent.

Posterior circulation: Intracranial vertebral arteries, basilar
artery, and posterior cerebral arteries are patent.

Venous sinuses: Patent as allowed by contrast bolus timing.

Review of the MIP images confirms the above findings
IMPRESSION: No acute intracranial abnormality.

No large vessel occlusion. Approximately 60% stenosis of the right
ICA origin.

## 2021-01-15 IMAGING — CR DG CHEST 2V
2 series · 2 of 2 positions shown · non-contrast
Comparison: August 20, 2014.

CLINICAL DATA: Chest pain, shortness of breath.

EXAM:
CHEST - 2 VIEW

[chest lat]
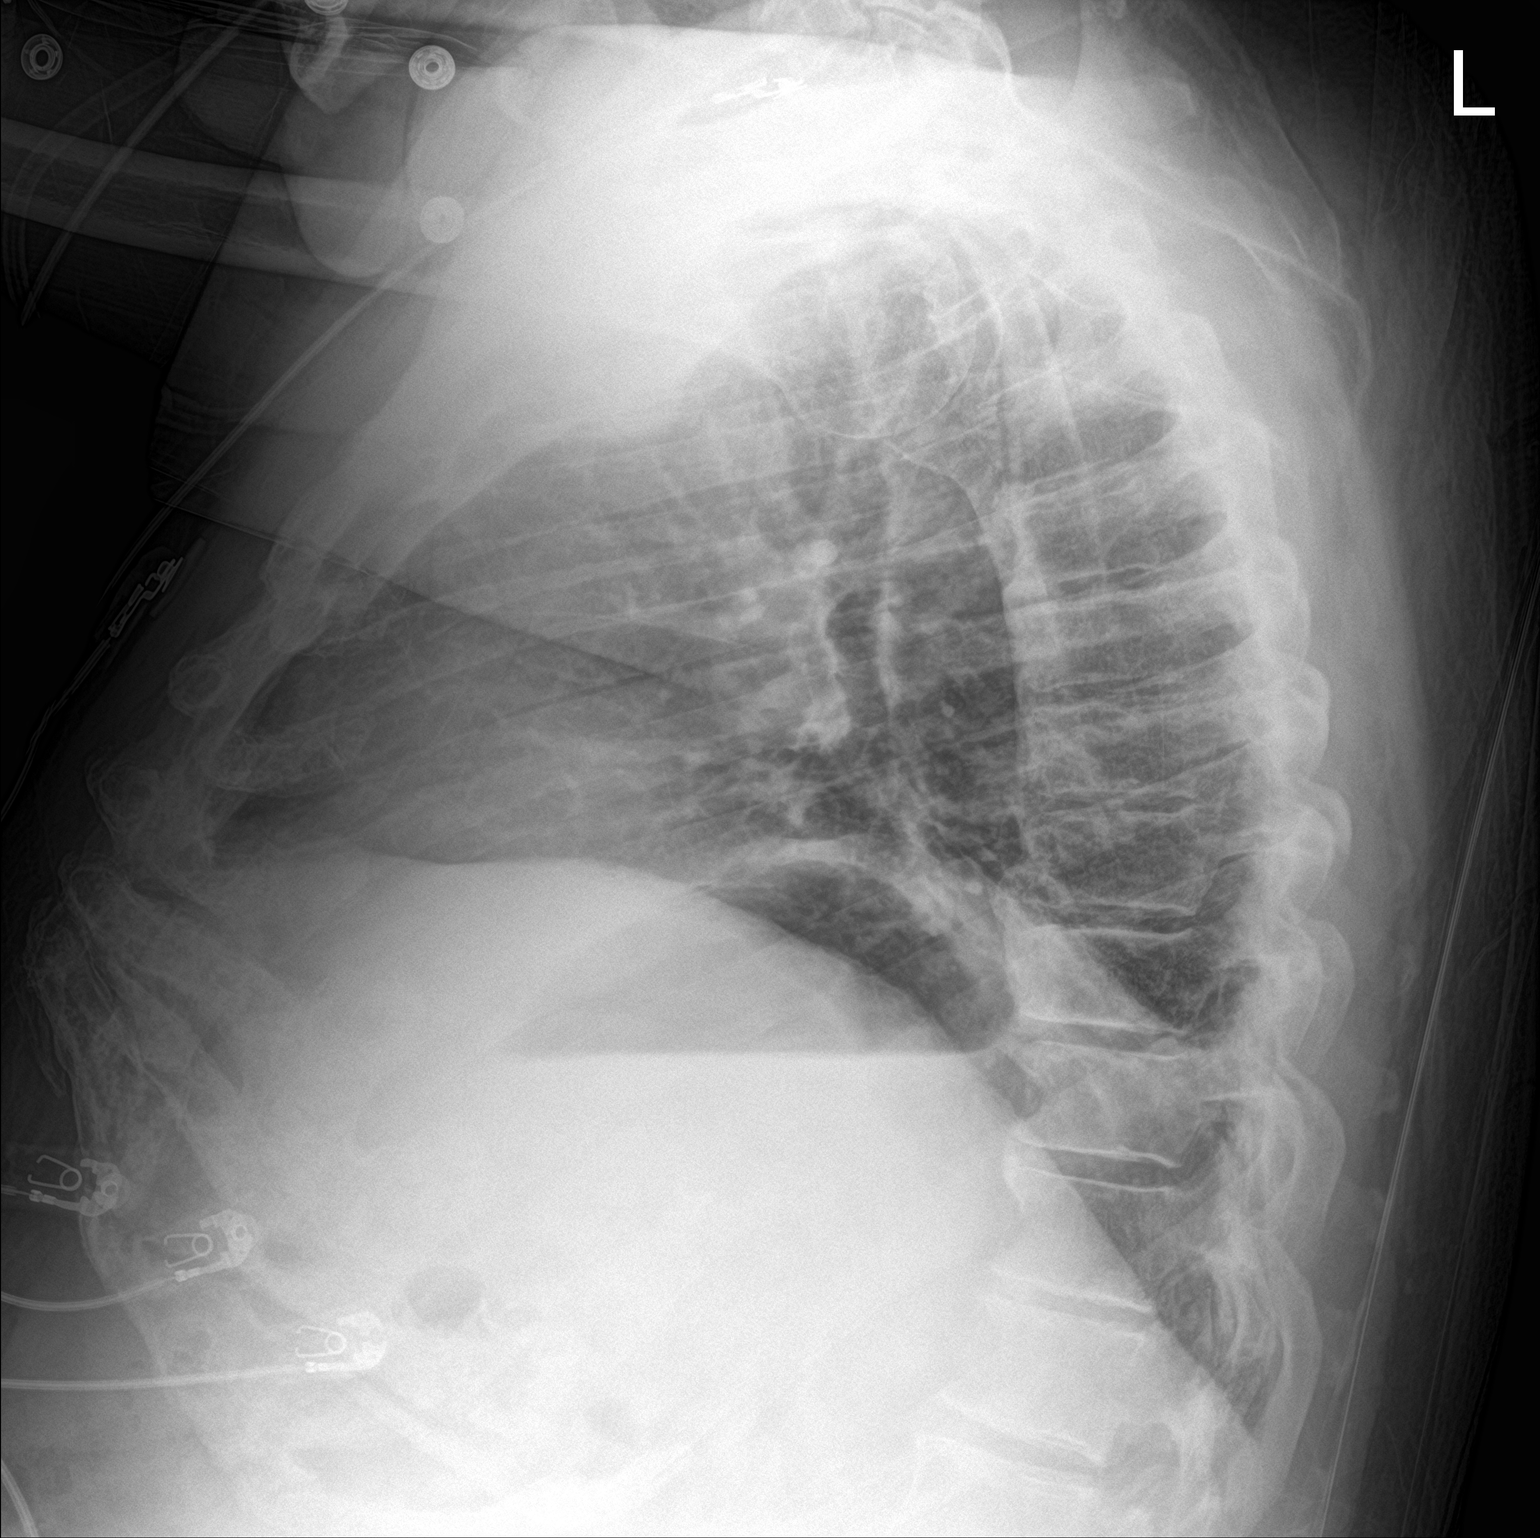

[chest ap]
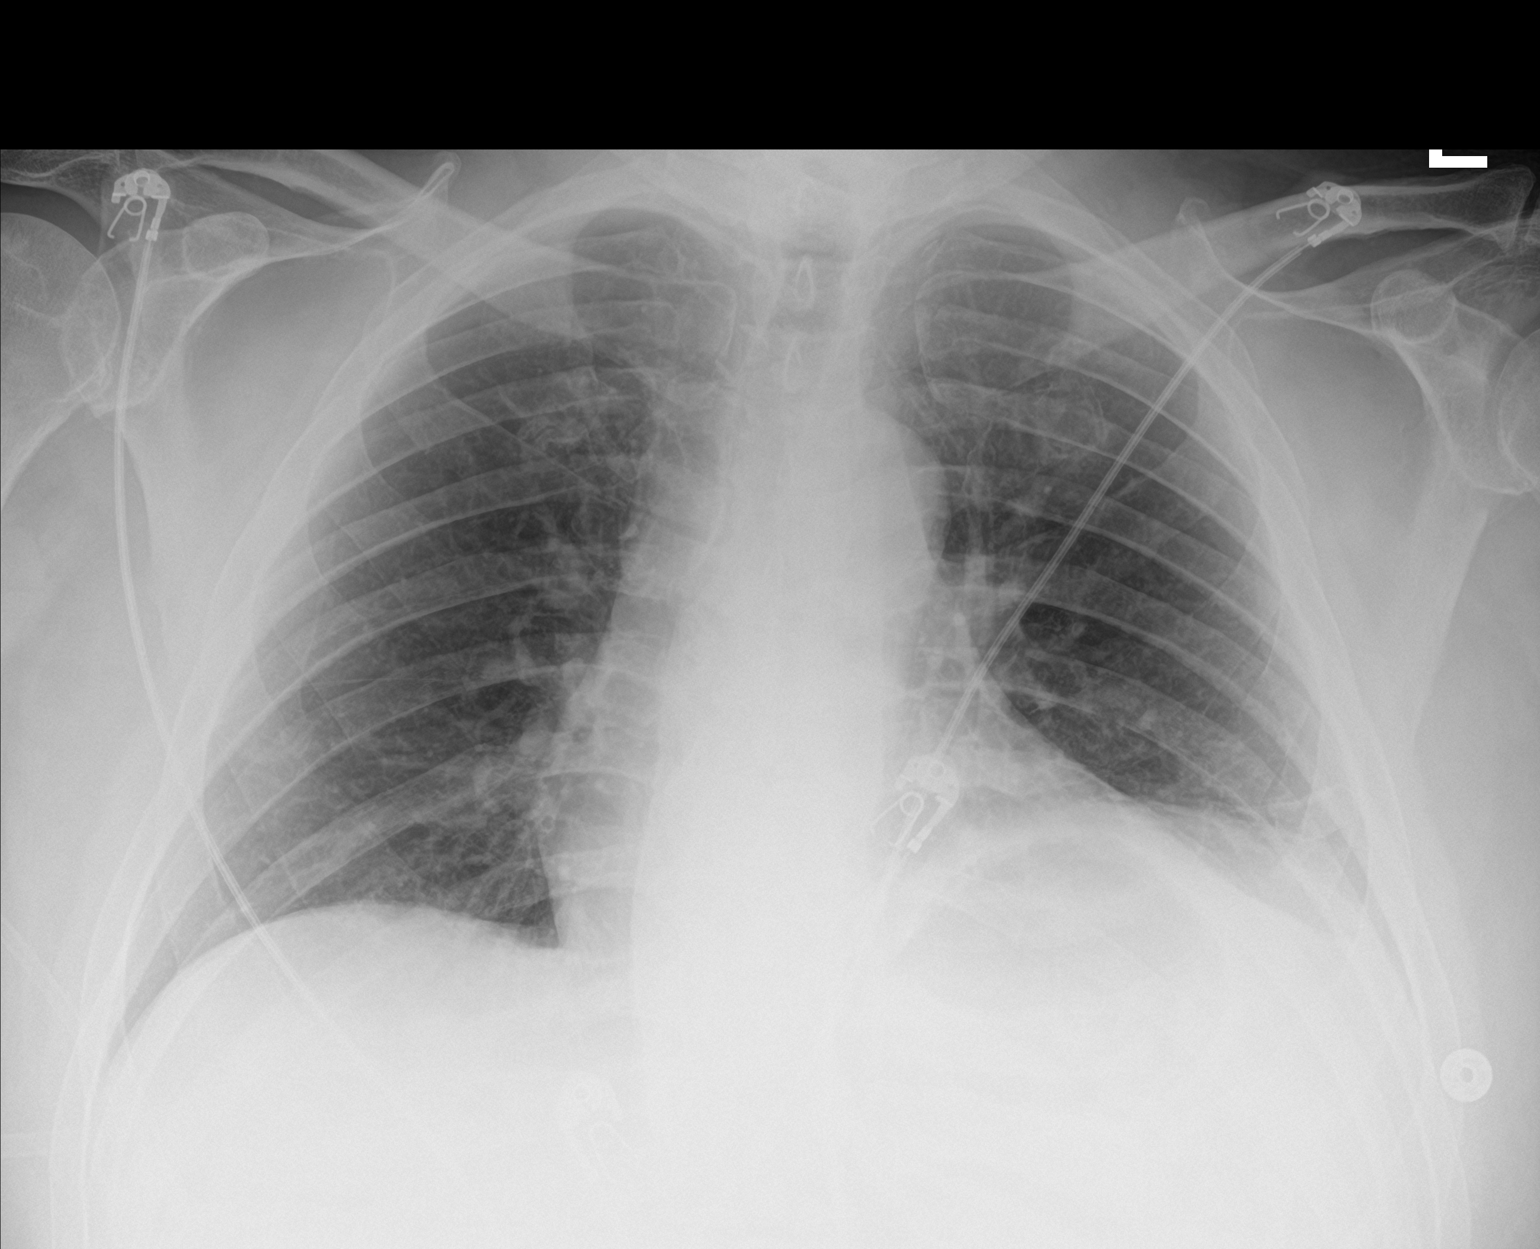

[2 of 2 positions shown; findings below may reference images not displayed]

FINDINGS: The heart size and mediastinal contours are within normal limits. No
pneumothorax or pleural effusion is noted. Right lung is clear.
Elevated left hemidiaphragm is noted with minimal left basilar
subsegmental atelectasis. The visualized skeletal structures are
unremarkable.
IMPRESSION: Elevated left hemidiaphragm with minimal left basilar subsegmental
atelectasis.

## 2021-01-30 DIAGNOSIS — E669 Obesity, unspecified: Secondary | ICD-10-CM | POA: Diagnosis not present

## 2021-01-30 DIAGNOSIS — Z1331 Encounter for screening for depression: Secondary | ICD-10-CM | POA: Diagnosis not present

## 2021-01-30 DIAGNOSIS — Z9181 History of falling: Secondary | ICD-10-CM | POA: Diagnosis not present

## 2021-01-30 DIAGNOSIS — Z139 Encounter for screening, unspecified: Secondary | ICD-10-CM | POA: Diagnosis not present

## 2021-01-30 DIAGNOSIS — Z0000 Encounter for general adult medical examination without abnormal findings: Secondary | ICD-10-CM | POA: Diagnosis not present

## 2021-01-30 DIAGNOSIS — E785 Hyperlipidemia, unspecified: Secondary | ICD-10-CM | POA: Diagnosis not present

## 2021-03-20 DIAGNOSIS — R809 Proteinuria, unspecified: Secondary | ICD-10-CM | POA: Diagnosis not present

## 2021-03-20 DIAGNOSIS — K5909 Other constipation: Secondary | ICD-10-CM | POA: Diagnosis not present

## 2021-03-20 DIAGNOSIS — M159 Polyosteoarthritis, unspecified: Secondary | ICD-10-CM | POA: Diagnosis not present

## 2021-03-20 DIAGNOSIS — I10 Essential (primary) hypertension: Secondary | ICD-10-CM | POA: Diagnosis not present

## 2021-03-20 DIAGNOSIS — Z6834 Body mass index (BMI) 34.0-34.9, adult: Secondary | ICD-10-CM | POA: Diagnosis not present

## 2021-03-20 DIAGNOSIS — C8590 Non-Hodgkin lymphoma, unspecified, unspecified site: Secondary | ICD-10-CM | POA: Diagnosis not present

## 2021-03-20 DIAGNOSIS — R0600 Dyspnea, unspecified: Secondary | ICD-10-CM | POA: Diagnosis not present

## 2021-03-20 DIAGNOSIS — E1129 Type 2 diabetes mellitus with other diabetic kidney complication: Secondary | ICD-10-CM | POA: Diagnosis not present

## 2021-03-20 DIAGNOSIS — E785 Hyperlipidemia, unspecified: Secondary | ICD-10-CM | POA: Diagnosis not present

## 2021-03-20 DIAGNOSIS — E669 Obesity, unspecified: Secondary | ICD-10-CM | POA: Diagnosis not present

## 2021-04-05 DIAGNOSIS — Z6834 Body mass index (BMI) 34.0-34.9, adult: Secondary | ICD-10-CM | POA: Diagnosis not present

## 2021-04-05 DIAGNOSIS — E669 Obesity, unspecified: Secondary | ICD-10-CM | POA: Diagnosis not present

## 2021-04-05 DIAGNOSIS — C8590 Non-Hodgkin lymphoma, unspecified, unspecified site: Secondary | ICD-10-CM | POA: Diagnosis not present

## 2021-04-05 DIAGNOSIS — K5909 Other constipation: Secondary | ICD-10-CM | POA: Diagnosis not present

## 2021-04-05 DIAGNOSIS — M159 Polyosteoarthritis, unspecified: Secondary | ICD-10-CM | POA: Diagnosis not present

## 2021-04-05 DIAGNOSIS — I10 Essential (primary) hypertension: Secondary | ICD-10-CM | POA: Diagnosis not present

## 2021-04-05 DIAGNOSIS — E785 Hyperlipidemia, unspecified: Secondary | ICD-10-CM | POA: Diagnosis not present

## 2021-04-05 DIAGNOSIS — R0600 Dyspnea, unspecified: Secondary | ICD-10-CM | POA: Diagnosis not present

## 2021-04-05 DIAGNOSIS — R809 Proteinuria, unspecified: Secondary | ICD-10-CM | POA: Diagnosis not present

## 2021-04-05 DIAGNOSIS — E1129 Type 2 diabetes mellitus with other diabetic kidney complication: Secondary | ICD-10-CM | POA: Diagnosis not present

## 2021-04-26 DIAGNOSIS — E785 Hyperlipidemia, unspecified: Secondary | ICD-10-CM | POA: Diagnosis not present

## 2021-04-26 DIAGNOSIS — R809 Proteinuria, unspecified: Secondary | ICD-10-CM | POA: Diagnosis not present

## 2021-04-26 DIAGNOSIS — Z6832 Body mass index (BMI) 32.0-32.9, adult: Secondary | ICD-10-CM | POA: Diagnosis not present

## 2021-04-26 DIAGNOSIS — K5909 Other constipation: Secondary | ICD-10-CM | POA: Diagnosis not present

## 2021-04-26 DIAGNOSIS — R0600 Dyspnea, unspecified: Secondary | ICD-10-CM | POA: Diagnosis not present

## 2021-04-26 DIAGNOSIS — E1129 Type 2 diabetes mellitus with other diabetic kidney complication: Secondary | ICD-10-CM | POA: Diagnosis not present

## 2021-04-26 DIAGNOSIS — C8590 Non-Hodgkin lymphoma, unspecified, unspecified site: Secondary | ICD-10-CM | POA: Diagnosis not present

## 2021-04-26 DIAGNOSIS — K5900 Constipation, unspecified: Secondary | ICD-10-CM | POA: Diagnosis not present

## 2021-04-26 DIAGNOSIS — E669 Obesity, unspecified: Secondary | ICD-10-CM | POA: Diagnosis not present

## 2021-04-26 DIAGNOSIS — M159 Polyosteoarthritis, unspecified: Secondary | ICD-10-CM | POA: Diagnosis not present

## 2021-04-26 DIAGNOSIS — I10 Essential (primary) hypertension: Secondary | ICD-10-CM | POA: Diagnosis not present

## 2021-06-15 DIAGNOSIS — E785 Hyperlipidemia, unspecified: Secondary | ICD-10-CM | POA: Diagnosis not present

## 2021-06-15 DIAGNOSIS — R0600 Dyspnea, unspecified: Secondary | ICD-10-CM | POA: Diagnosis not present

## 2021-06-15 DIAGNOSIS — K5900 Constipation, unspecified: Secondary | ICD-10-CM | POA: Diagnosis not present

## 2021-06-15 DIAGNOSIS — Z6833 Body mass index (BMI) 33.0-33.9, adult: Secondary | ICD-10-CM | POA: Diagnosis not present

## 2021-06-15 DIAGNOSIS — R809 Proteinuria, unspecified: Secondary | ICD-10-CM | POA: Diagnosis not present

## 2021-06-15 DIAGNOSIS — K5909 Other constipation: Secondary | ICD-10-CM | POA: Diagnosis not present

## 2021-06-15 DIAGNOSIS — I10 Essential (primary) hypertension: Secondary | ICD-10-CM | POA: Diagnosis not present

## 2021-06-15 DIAGNOSIS — E669 Obesity, unspecified: Secondary | ICD-10-CM | POA: Diagnosis not present

## 2021-06-15 DIAGNOSIS — C8590 Non-Hodgkin lymphoma, unspecified, unspecified site: Secondary | ICD-10-CM | POA: Diagnosis not present

## 2021-06-15 DIAGNOSIS — M159 Polyosteoarthritis, unspecified: Secondary | ICD-10-CM | POA: Diagnosis not present

## 2021-06-15 DIAGNOSIS — E1129 Type 2 diabetes mellitus with other diabetic kidney complication: Secondary | ICD-10-CM | POA: Diagnosis not present

## 2021-06-22 DIAGNOSIS — C8590 Non-Hodgkin lymphoma, unspecified, unspecified site: Secondary | ICD-10-CM | POA: Diagnosis not present

## 2021-06-22 DIAGNOSIS — K5909 Other constipation: Secondary | ICD-10-CM | POA: Diagnosis not present

## 2021-06-22 DIAGNOSIS — K5900 Constipation, unspecified: Secondary | ICD-10-CM | POA: Diagnosis not present

## 2021-06-22 DIAGNOSIS — M159 Polyosteoarthritis, unspecified: Secondary | ICD-10-CM | POA: Diagnosis not present

## 2021-06-22 DIAGNOSIS — Z6833 Body mass index (BMI) 33.0-33.9, adult: Secondary | ICD-10-CM | POA: Diagnosis not present

## 2021-06-22 DIAGNOSIS — R809 Proteinuria, unspecified: Secondary | ICD-10-CM | POA: Diagnosis not present

## 2021-06-22 DIAGNOSIS — E1129 Type 2 diabetes mellitus with other diabetic kidney complication: Secondary | ICD-10-CM | POA: Diagnosis not present

## 2021-06-22 DIAGNOSIS — E785 Hyperlipidemia, unspecified: Secondary | ICD-10-CM | POA: Diagnosis not present

## 2021-06-22 DIAGNOSIS — E669 Obesity, unspecified: Secondary | ICD-10-CM | POA: Diagnosis not present

## 2021-06-22 DIAGNOSIS — R0600 Dyspnea, unspecified: Secondary | ICD-10-CM | POA: Diagnosis not present

## 2021-06-22 DIAGNOSIS — I10 Essential (primary) hypertension: Secondary | ICD-10-CM | POA: Diagnosis not present

## 2021-07-06 DIAGNOSIS — I10 Essential (primary) hypertension: Secondary | ICD-10-CM | POA: Diagnosis not present

## 2021-07-06 DIAGNOSIS — R809 Proteinuria, unspecified: Secondary | ICD-10-CM | POA: Diagnosis not present

## 2021-07-06 DIAGNOSIS — M159 Polyosteoarthritis, unspecified: Secondary | ICD-10-CM | POA: Diagnosis not present

## 2021-07-06 DIAGNOSIS — R0600 Dyspnea, unspecified: Secondary | ICD-10-CM | POA: Diagnosis not present

## 2021-07-06 DIAGNOSIS — E1129 Type 2 diabetes mellitus with other diabetic kidney complication: Secondary | ICD-10-CM | POA: Diagnosis not present

## 2021-07-06 DIAGNOSIS — Z6832 Body mass index (BMI) 32.0-32.9, adult: Secondary | ICD-10-CM | POA: Diagnosis not present

## 2021-07-06 DIAGNOSIS — C8590 Non-Hodgkin lymphoma, unspecified, unspecified site: Secondary | ICD-10-CM | POA: Diagnosis not present

## 2021-07-06 DIAGNOSIS — E669 Obesity, unspecified: Secondary | ICD-10-CM | POA: Diagnosis not present

## 2021-07-06 DIAGNOSIS — K5900 Constipation, unspecified: Secondary | ICD-10-CM | POA: Diagnosis not present

## 2021-07-06 DIAGNOSIS — K5909 Other constipation: Secondary | ICD-10-CM | POA: Diagnosis not present

## 2021-07-06 DIAGNOSIS — E785 Hyperlipidemia, unspecified: Secondary | ICD-10-CM | POA: Diagnosis not present

## 2021-07-11 DIAGNOSIS — Z6832 Body mass index (BMI) 32.0-32.9, adult: Secondary | ICD-10-CM | POA: Diagnosis not present

## 2021-07-11 DIAGNOSIS — M159 Polyosteoarthritis, unspecified: Secondary | ICD-10-CM | POA: Diagnosis not present

## 2021-07-11 DIAGNOSIS — E785 Hyperlipidemia, unspecified: Secondary | ICD-10-CM | POA: Diagnosis not present

## 2021-07-11 DIAGNOSIS — K5900 Constipation, unspecified: Secondary | ICD-10-CM | POA: Diagnosis not present

## 2021-07-11 DIAGNOSIS — E875 Hyperkalemia: Secondary | ICD-10-CM | POA: Diagnosis not present

## 2021-07-11 DIAGNOSIS — R809 Proteinuria, unspecified: Secondary | ICD-10-CM | POA: Diagnosis not present

## 2021-07-11 DIAGNOSIS — I10 Essential (primary) hypertension: Secondary | ICD-10-CM | POA: Diagnosis not present

## 2021-07-11 DIAGNOSIS — C8590 Non-Hodgkin lymphoma, unspecified, unspecified site: Secondary | ICD-10-CM | POA: Diagnosis not present

## 2021-07-11 DIAGNOSIS — R0600 Dyspnea, unspecified: Secondary | ICD-10-CM | POA: Diagnosis not present

## 2021-07-11 DIAGNOSIS — K5909 Other constipation: Secondary | ICD-10-CM | POA: Diagnosis not present

## 2021-07-11 DIAGNOSIS — E1129 Type 2 diabetes mellitus with other diabetic kidney complication: Secondary | ICD-10-CM | POA: Diagnosis not present

## 2021-07-11 DIAGNOSIS — E669 Obesity, unspecified: Secondary | ICD-10-CM | POA: Diagnosis not present

## 2021-07-19 DIAGNOSIS — E1129 Type 2 diabetes mellitus with other diabetic kidney complication: Secondary | ICD-10-CM | POA: Diagnosis not present

## 2021-07-19 DIAGNOSIS — Z6832 Body mass index (BMI) 32.0-32.9, adult: Secondary | ICD-10-CM | POA: Diagnosis not present

## 2021-07-19 DIAGNOSIS — E669 Obesity, unspecified: Secondary | ICD-10-CM | POA: Diagnosis not present

## 2021-07-19 DIAGNOSIS — R0600 Dyspnea, unspecified: Secondary | ICD-10-CM | POA: Diagnosis not present

## 2021-07-19 DIAGNOSIS — E875 Hyperkalemia: Secondary | ICD-10-CM | POA: Diagnosis not present

## 2021-07-19 DIAGNOSIS — K5909 Other constipation: Secondary | ICD-10-CM | POA: Diagnosis not present

## 2021-07-19 DIAGNOSIS — M159 Polyosteoarthritis, unspecified: Secondary | ICD-10-CM | POA: Diagnosis not present

## 2021-07-19 DIAGNOSIS — I10 Essential (primary) hypertension: Secondary | ICD-10-CM | POA: Diagnosis not present

## 2021-07-19 DIAGNOSIS — K5900 Constipation, unspecified: Secondary | ICD-10-CM | POA: Diagnosis not present

## 2021-07-19 DIAGNOSIS — R809 Proteinuria, unspecified: Secondary | ICD-10-CM | POA: Diagnosis not present

## 2021-07-19 DIAGNOSIS — E785 Hyperlipidemia, unspecified: Secondary | ICD-10-CM | POA: Diagnosis not present

## 2021-07-19 DIAGNOSIS — C8590 Non-Hodgkin lymphoma, unspecified, unspecified site: Secondary | ICD-10-CM | POA: Diagnosis not present

## 2021-07-24 DIAGNOSIS — Z794 Long term (current) use of insulin: Secondary | ICD-10-CM | POA: Diagnosis not present

## 2021-07-24 DIAGNOSIS — Z9221 Personal history of antineoplastic chemotherapy: Secondary | ICD-10-CM | POA: Diagnosis not present

## 2021-07-24 DIAGNOSIS — Z7982 Long term (current) use of aspirin: Secondary | ICD-10-CM | POA: Diagnosis not present

## 2021-07-24 DIAGNOSIS — Z79899 Other long term (current) drug therapy: Secondary | ICD-10-CM | POA: Diagnosis not present

## 2021-07-24 DIAGNOSIS — Z7984 Long term (current) use of oral hypoglycemic drugs: Secondary | ICD-10-CM | POA: Diagnosis not present

## 2021-07-24 DIAGNOSIS — C8330 Diffuse large B-cell lymphoma, unspecified site: Secondary | ICD-10-CM | POA: Diagnosis not present

## 2021-07-26 DIAGNOSIS — Z7689 Persons encountering health services in other specified circumstances: Secondary | ICD-10-CM | POA: Diagnosis not present

## 2021-08-03 DIAGNOSIS — C8590 Non-Hodgkin lymphoma, unspecified, unspecified site: Secondary | ICD-10-CM | POA: Diagnosis not present

## 2021-08-03 DIAGNOSIS — E785 Hyperlipidemia, unspecified: Secondary | ICD-10-CM | POA: Diagnosis not present

## 2021-08-03 DIAGNOSIS — R809 Proteinuria, unspecified: Secondary | ICD-10-CM | POA: Diagnosis not present

## 2021-08-03 DIAGNOSIS — K5900 Constipation, unspecified: Secondary | ICD-10-CM | POA: Diagnosis not present

## 2021-08-03 DIAGNOSIS — E669 Obesity, unspecified: Secondary | ICD-10-CM | POA: Diagnosis not present

## 2021-08-03 DIAGNOSIS — E875 Hyperkalemia: Secondary | ICD-10-CM | POA: Diagnosis not present

## 2021-08-03 DIAGNOSIS — E1129 Type 2 diabetes mellitus with other diabetic kidney complication: Secondary | ICD-10-CM | POA: Diagnosis not present

## 2021-08-03 DIAGNOSIS — Z6832 Body mass index (BMI) 32.0-32.9, adult: Secondary | ICD-10-CM | POA: Diagnosis not present

## 2021-08-03 DIAGNOSIS — K5909 Other constipation: Secondary | ICD-10-CM | POA: Diagnosis not present

## 2021-08-03 DIAGNOSIS — R0600 Dyspnea, unspecified: Secondary | ICD-10-CM | POA: Diagnosis not present

## 2021-08-03 DIAGNOSIS — M159 Polyosteoarthritis, unspecified: Secondary | ICD-10-CM | POA: Diagnosis not present

## 2021-08-03 DIAGNOSIS — I10 Essential (primary) hypertension: Secondary | ICD-10-CM | POA: Diagnosis not present

## 2021-08-30 DIAGNOSIS — E785 Hyperlipidemia, unspecified: Secondary | ICD-10-CM | POA: Diagnosis not present

## 2021-08-30 DIAGNOSIS — E669 Obesity, unspecified: Secondary | ICD-10-CM | POA: Diagnosis not present

## 2021-08-30 DIAGNOSIS — M159 Polyosteoarthritis, unspecified: Secondary | ICD-10-CM | POA: Diagnosis not present

## 2021-08-30 DIAGNOSIS — R0600 Dyspnea, unspecified: Secondary | ICD-10-CM | POA: Diagnosis not present

## 2021-08-30 DIAGNOSIS — E875 Hyperkalemia: Secondary | ICD-10-CM | POA: Diagnosis not present

## 2021-08-30 DIAGNOSIS — E1129 Type 2 diabetes mellitus with other diabetic kidney complication: Secondary | ICD-10-CM | POA: Diagnosis not present

## 2021-08-30 DIAGNOSIS — Z6832 Body mass index (BMI) 32.0-32.9, adult: Secondary | ICD-10-CM | POA: Diagnosis not present

## 2021-08-30 DIAGNOSIS — C8590 Non-Hodgkin lymphoma, unspecified, unspecified site: Secondary | ICD-10-CM | POA: Diagnosis not present

## 2021-08-30 DIAGNOSIS — K5909 Other constipation: Secondary | ICD-10-CM | POA: Diagnosis not present

## 2021-08-30 DIAGNOSIS — K5900 Constipation, unspecified: Secondary | ICD-10-CM | POA: Diagnosis not present

## 2021-08-30 DIAGNOSIS — I10 Essential (primary) hypertension: Secondary | ICD-10-CM | POA: Diagnosis not present

## 2021-08-30 DIAGNOSIS — R809 Proteinuria, unspecified: Secondary | ICD-10-CM | POA: Diagnosis not present

## 2021-09-04 DIAGNOSIS — E875 Hyperkalemia: Secondary | ICD-10-CM | POA: Diagnosis not present

## 2021-09-12 DIAGNOSIS — E119 Type 2 diabetes mellitus without complications: Secondary | ICD-10-CM | POA: Diagnosis not present

## 2021-09-12 DIAGNOSIS — I10 Essential (primary) hypertension: Secondary | ICD-10-CM | POA: Diagnosis not present

## 2021-09-13 DIAGNOSIS — E875 Hyperkalemia: Secondary | ICD-10-CM | POA: Diagnosis not present

## 2021-09-27 DIAGNOSIS — K5909 Other constipation: Secondary | ICD-10-CM | POA: Diagnosis not present

## 2021-09-27 DIAGNOSIS — E669 Obesity, unspecified: Secondary | ICD-10-CM | POA: Diagnosis not present

## 2021-09-27 DIAGNOSIS — E785 Hyperlipidemia, unspecified: Secondary | ICD-10-CM | POA: Diagnosis not present

## 2021-09-27 DIAGNOSIS — K5900 Constipation, unspecified: Secondary | ICD-10-CM | POA: Diagnosis not present

## 2021-09-27 DIAGNOSIS — I10 Essential (primary) hypertension: Secondary | ICD-10-CM | POA: Diagnosis not present

## 2021-09-27 DIAGNOSIS — R809 Proteinuria, unspecified: Secondary | ICD-10-CM | POA: Diagnosis not present

## 2021-09-27 DIAGNOSIS — R0600 Dyspnea, unspecified: Secondary | ICD-10-CM | POA: Diagnosis not present

## 2021-09-27 DIAGNOSIS — E875 Hyperkalemia: Secondary | ICD-10-CM | POA: Diagnosis not present

## 2021-09-27 DIAGNOSIS — M159 Polyosteoarthritis, unspecified: Secondary | ICD-10-CM | POA: Diagnosis not present

## 2021-09-27 DIAGNOSIS — Z23 Encounter for immunization: Secondary | ICD-10-CM | POA: Diagnosis not present

## 2021-09-27 DIAGNOSIS — E1129 Type 2 diabetes mellitus with other diabetic kidney complication: Secondary | ICD-10-CM | POA: Diagnosis not present

## 2021-09-27 DIAGNOSIS — C8590 Non-Hodgkin lymphoma, unspecified, unspecified site: Secondary | ICD-10-CM | POA: Diagnosis not present

## 2021-10-04 DIAGNOSIS — R0600 Dyspnea, unspecified: Secondary | ICD-10-CM | POA: Diagnosis not present

## 2021-10-04 DIAGNOSIS — Z6833 Body mass index (BMI) 33.0-33.9, adult: Secondary | ICD-10-CM | POA: Diagnosis not present

## 2021-10-04 DIAGNOSIS — E1129 Type 2 diabetes mellitus with other diabetic kidney complication: Secondary | ICD-10-CM | POA: Diagnosis not present

## 2021-10-04 DIAGNOSIS — K5900 Constipation, unspecified: Secondary | ICD-10-CM | POA: Diagnosis not present

## 2021-10-04 DIAGNOSIS — C8590 Non-Hodgkin lymphoma, unspecified, unspecified site: Secondary | ICD-10-CM | POA: Diagnosis not present

## 2021-10-04 DIAGNOSIS — M159 Polyosteoarthritis, unspecified: Secondary | ICD-10-CM | POA: Diagnosis not present

## 2021-10-04 DIAGNOSIS — I10 Essential (primary) hypertension: Secondary | ICD-10-CM | POA: Diagnosis not present

## 2021-10-04 DIAGNOSIS — R809 Proteinuria, unspecified: Secondary | ICD-10-CM | POA: Diagnosis not present

## 2021-10-04 DIAGNOSIS — K5909 Other constipation: Secondary | ICD-10-CM | POA: Diagnosis not present

## 2021-10-04 DIAGNOSIS — E669 Obesity, unspecified: Secondary | ICD-10-CM | POA: Diagnosis not present

## 2021-10-04 DIAGNOSIS — E785 Hyperlipidemia, unspecified: Secondary | ICD-10-CM | POA: Diagnosis not present

## 2021-10-04 DIAGNOSIS — E875 Hyperkalemia: Secondary | ICD-10-CM | POA: Diagnosis not present

## 2021-10-23 DIAGNOSIS — E669 Obesity, unspecified: Secondary | ICD-10-CM | POA: Diagnosis not present

## 2021-10-23 DIAGNOSIS — K5900 Constipation, unspecified: Secondary | ICD-10-CM | POA: Diagnosis not present

## 2021-10-23 DIAGNOSIS — E875 Hyperkalemia: Secondary | ICD-10-CM | POA: Diagnosis not present

## 2021-10-23 DIAGNOSIS — C8590 Non-Hodgkin lymphoma, unspecified, unspecified site: Secondary | ICD-10-CM | POA: Diagnosis not present

## 2021-10-23 DIAGNOSIS — R0600 Dyspnea, unspecified: Secondary | ICD-10-CM | POA: Diagnosis not present

## 2021-10-23 DIAGNOSIS — E1129 Type 2 diabetes mellitus with other diabetic kidney complication: Secondary | ICD-10-CM | POA: Diagnosis not present

## 2021-10-23 DIAGNOSIS — K5909 Other constipation: Secondary | ICD-10-CM | POA: Diagnosis not present

## 2021-10-23 DIAGNOSIS — Z6833 Body mass index (BMI) 33.0-33.9, adult: Secondary | ICD-10-CM | POA: Diagnosis not present

## 2021-10-23 DIAGNOSIS — E785 Hyperlipidemia, unspecified: Secondary | ICD-10-CM | POA: Diagnosis not present

## 2021-10-23 DIAGNOSIS — M159 Polyosteoarthritis, unspecified: Secondary | ICD-10-CM | POA: Diagnosis not present

## 2021-10-23 DIAGNOSIS — R809 Proteinuria, unspecified: Secondary | ICD-10-CM | POA: Diagnosis not present

## 2021-10-23 DIAGNOSIS — I10 Essential (primary) hypertension: Secondary | ICD-10-CM | POA: Diagnosis not present

## 2021-10-29 DIAGNOSIS — Z794 Long term (current) use of insulin: Secondary | ICD-10-CM | POA: Diagnosis not present

## 2021-10-29 DIAGNOSIS — Z789 Other specified health status: Secondary | ICD-10-CM | POA: Diagnosis not present

## 2021-10-29 DIAGNOSIS — E1140 Type 2 diabetes mellitus with diabetic neuropathy, unspecified: Secondary | ICD-10-CM | POA: Diagnosis not present

## 2021-10-29 DIAGNOSIS — E11649 Type 2 diabetes mellitus with hypoglycemia without coma: Secondary | ICD-10-CM | POA: Diagnosis not present

## 2021-10-31 DIAGNOSIS — E785 Hyperlipidemia, unspecified: Secondary | ICD-10-CM | POA: Diagnosis not present

## 2021-10-31 DIAGNOSIS — I10 Essential (primary) hypertension: Secondary | ICD-10-CM | POA: Diagnosis not present

## 2021-11-02 DIAGNOSIS — L570 Actinic keratosis: Secondary | ICD-10-CM | POA: Diagnosis not present

## 2021-11-02 DIAGNOSIS — C44319 Basal cell carcinoma of skin of other parts of face: Secondary | ICD-10-CM | POA: Diagnosis not present

## 2021-11-21 DIAGNOSIS — E875 Hyperkalemia: Secondary | ICD-10-CM | POA: Diagnosis not present

## 2021-11-21 DIAGNOSIS — E1129 Type 2 diabetes mellitus with other diabetic kidney complication: Secondary | ICD-10-CM | POA: Diagnosis not present

## 2021-11-21 DIAGNOSIS — K5900 Constipation, unspecified: Secondary | ICD-10-CM | POA: Diagnosis not present

## 2021-11-21 DIAGNOSIS — C8590 Non-Hodgkin lymphoma, unspecified, unspecified site: Secondary | ICD-10-CM | POA: Diagnosis not present

## 2021-11-21 DIAGNOSIS — M159 Polyosteoarthritis, unspecified: Secondary | ICD-10-CM | POA: Diagnosis not present

## 2021-11-21 DIAGNOSIS — Z6833 Body mass index (BMI) 33.0-33.9, adult: Secondary | ICD-10-CM | POA: Diagnosis not present

## 2021-11-21 DIAGNOSIS — R809 Proteinuria, unspecified: Secondary | ICD-10-CM | POA: Diagnosis not present

## 2021-11-21 DIAGNOSIS — I10 Essential (primary) hypertension: Secondary | ICD-10-CM | POA: Diagnosis not present

## 2021-11-21 DIAGNOSIS — K5909 Other constipation: Secondary | ICD-10-CM | POA: Diagnosis not present

## 2021-11-21 DIAGNOSIS — R0600 Dyspnea, unspecified: Secondary | ICD-10-CM | POA: Diagnosis not present

## 2021-11-21 DIAGNOSIS — E669 Obesity, unspecified: Secondary | ICD-10-CM | POA: Diagnosis not present

## 2021-11-21 DIAGNOSIS — E785 Hyperlipidemia, unspecified: Secondary | ICD-10-CM | POA: Diagnosis not present

## 2021-11-27 DIAGNOSIS — M25561 Pain in right knee: Secondary | ICD-10-CM | POA: Diagnosis not present

## 2021-11-27 DIAGNOSIS — E1140 Type 2 diabetes mellitus with diabetic neuropathy, unspecified: Secondary | ICD-10-CM | POA: Diagnosis not present

## 2021-11-27 DIAGNOSIS — R809 Proteinuria, unspecified: Secondary | ICD-10-CM | POA: Diagnosis not present

## 2021-11-27 DIAGNOSIS — C8330 Diffuse large B-cell lymphoma, unspecified site: Secondary | ICD-10-CM | POA: Diagnosis not present

## 2021-11-27 DIAGNOSIS — G8929 Other chronic pain: Secondary | ICD-10-CM | POA: Diagnosis not present

## 2021-11-27 DIAGNOSIS — Z794 Long term (current) use of insulin: Secondary | ICD-10-CM | POA: Diagnosis not present

## 2021-11-27 DIAGNOSIS — K5901 Slow transit constipation: Secondary | ICD-10-CM | POA: Diagnosis not present

## 2022-01-01 DIAGNOSIS — E785 Hyperlipidemia, unspecified: Secondary | ICD-10-CM | POA: Diagnosis not present

## 2022-01-01 DIAGNOSIS — E1129 Type 2 diabetes mellitus with other diabetic kidney complication: Secondary | ICD-10-CM | POA: Diagnosis not present

## 2022-01-01 DIAGNOSIS — I10 Essential (primary) hypertension: Secondary | ICD-10-CM | POA: Diagnosis not present

## 2022-01-04 DIAGNOSIS — E875 Hyperkalemia: Secondary | ICD-10-CM | POA: Diagnosis not present

## 2022-01-04 DIAGNOSIS — I10 Essential (primary) hypertension: Secondary | ICD-10-CM | POA: Diagnosis not present

## 2022-01-04 DIAGNOSIS — R0600 Dyspnea, unspecified: Secondary | ICD-10-CM | POA: Diagnosis not present

## 2022-01-04 DIAGNOSIS — K5909 Other constipation: Secondary | ICD-10-CM | POA: Diagnosis not present

## 2022-01-04 DIAGNOSIS — C8590 Non-Hodgkin lymphoma, unspecified, unspecified site: Secondary | ICD-10-CM | POA: Diagnosis not present

## 2022-01-04 DIAGNOSIS — E1129 Type 2 diabetes mellitus with other diabetic kidney complication: Secondary | ICD-10-CM | POA: Diagnosis not present

## 2022-01-04 DIAGNOSIS — M159 Polyosteoarthritis, unspecified: Secondary | ICD-10-CM | POA: Diagnosis not present

## 2022-01-04 DIAGNOSIS — K5900 Constipation, unspecified: Secondary | ICD-10-CM | POA: Diagnosis not present

## 2022-01-04 DIAGNOSIS — R809 Proteinuria, unspecified: Secondary | ICD-10-CM | POA: Diagnosis not present

## 2022-01-04 DIAGNOSIS — Z6833 Body mass index (BMI) 33.0-33.9, adult: Secondary | ICD-10-CM | POA: Diagnosis not present

## 2022-01-04 DIAGNOSIS — E785 Hyperlipidemia, unspecified: Secondary | ICD-10-CM | POA: Diagnosis not present

## 2022-01-04 DIAGNOSIS — E669 Obesity, unspecified: Secondary | ICD-10-CM | POA: Diagnosis not present

## 2022-01-25 DIAGNOSIS — R809 Proteinuria, unspecified: Secondary | ICD-10-CM | POA: Diagnosis not present

## 2022-01-25 DIAGNOSIS — E785 Hyperlipidemia, unspecified: Secondary | ICD-10-CM | POA: Diagnosis not present

## 2022-01-25 DIAGNOSIS — K5909 Other constipation: Secondary | ICD-10-CM | POA: Diagnosis not present

## 2022-01-25 DIAGNOSIS — M159 Polyosteoarthritis, unspecified: Secondary | ICD-10-CM | POA: Diagnosis not present

## 2022-01-25 DIAGNOSIS — Z6833 Body mass index (BMI) 33.0-33.9, adult: Secondary | ICD-10-CM | POA: Diagnosis not present

## 2022-01-25 DIAGNOSIS — E669 Obesity, unspecified: Secondary | ICD-10-CM | POA: Diagnosis not present

## 2022-01-25 DIAGNOSIS — I10 Essential (primary) hypertension: Secondary | ICD-10-CM | POA: Diagnosis not present

## 2022-01-25 DIAGNOSIS — E1129 Type 2 diabetes mellitus with other diabetic kidney complication: Secondary | ICD-10-CM | POA: Diagnosis not present

## 2022-01-31 DIAGNOSIS — I10 Essential (primary) hypertension: Secondary | ICD-10-CM | POA: Diagnosis not present

## 2022-02-04 DIAGNOSIS — I10 Essential (primary) hypertension: Secondary | ICD-10-CM | POA: Diagnosis not present

## 2022-02-07 DIAGNOSIS — E785 Hyperlipidemia, unspecified: Secondary | ICD-10-CM | POA: Diagnosis not present

## 2022-02-07 DIAGNOSIS — K5909 Other constipation: Secondary | ICD-10-CM | POA: Diagnosis not present

## 2022-02-07 DIAGNOSIS — M159 Polyosteoarthritis, unspecified: Secondary | ICD-10-CM | POA: Diagnosis not present

## 2022-02-07 DIAGNOSIS — R809 Proteinuria, unspecified: Secondary | ICD-10-CM | POA: Diagnosis not present

## 2022-02-07 DIAGNOSIS — E1129 Type 2 diabetes mellitus with other diabetic kidney complication: Secondary | ICD-10-CM | POA: Diagnosis not present

## 2022-02-07 DIAGNOSIS — Z6833 Body mass index (BMI) 33.0-33.9, adult: Secondary | ICD-10-CM | POA: Diagnosis not present

## 2022-02-07 DIAGNOSIS — I10 Essential (primary) hypertension: Secondary | ICD-10-CM | POA: Diagnosis not present

## 2022-02-07 DIAGNOSIS — E669 Obesity, unspecified: Secondary | ICD-10-CM | POA: Diagnosis not present

## 2022-02-19 DIAGNOSIS — I10 Essential (primary) hypertension: Secondary | ICD-10-CM | POA: Diagnosis not present

## 2022-03-05 DIAGNOSIS — I10 Essential (primary) hypertension: Secondary | ICD-10-CM | POA: Diagnosis not present

## 2022-03-07 DIAGNOSIS — E785 Hyperlipidemia, unspecified: Secondary | ICD-10-CM | POA: Diagnosis not present

## 2022-03-07 DIAGNOSIS — E669 Obesity, unspecified: Secondary | ICD-10-CM | POA: Diagnosis not present

## 2022-03-07 DIAGNOSIS — I10 Essential (primary) hypertension: Secondary | ICD-10-CM | POA: Diagnosis not present

## 2022-03-07 DIAGNOSIS — Z6834 Body mass index (BMI) 34.0-34.9, adult: Secondary | ICD-10-CM | POA: Diagnosis not present

## 2022-03-07 DIAGNOSIS — E1129 Type 2 diabetes mellitus with other diabetic kidney complication: Secondary | ICD-10-CM | POA: Diagnosis not present

## 2022-03-07 DIAGNOSIS — M159 Polyosteoarthritis, unspecified: Secondary | ICD-10-CM | POA: Diagnosis not present

## 2022-03-07 DIAGNOSIS — K5909 Other constipation: Secondary | ICD-10-CM | POA: Diagnosis not present

## 2022-03-07 DIAGNOSIS — R809 Proteinuria, unspecified: Secondary | ICD-10-CM | POA: Diagnosis not present

## 2022-05-06 DIAGNOSIS — I10 Essential (primary) hypertension: Secondary | ICD-10-CM | POA: Diagnosis not present

## 2022-05-07 DIAGNOSIS — L578 Other skin changes due to chronic exposure to nonionizing radiation: Secondary | ICD-10-CM | POA: Diagnosis not present

## 2022-05-07 DIAGNOSIS — C44319 Basal cell carcinoma of skin of other parts of face: Secondary | ICD-10-CM | POA: Diagnosis not present

## 2022-05-07 DIAGNOSIS — L821 Other seborrheic keratosis: Secondary | ICD-10-CM | POA: Diagnosis not present

## 2022-05-07 DIAGNOSIS — B079 Viral wart, unspecified: Secondary | ICD-10-CM | POA: Diagnosis not present

## 2022-05-07 DIAGNOSIS — L570 Actinic keratosis: Secondary | ICD-10-CM | POA: Diagnosis not present

## 2022-05-08 DIAGNOSIS — K5909 Other constipation: Secondary | ICD-10-CM | POA: Diagnosis not present

## 2022-05-08 DIAGNOSIS — E785 Hyperlipidemia, unspecified: Secondary | ICD-10-CM | POA: Diagnosis not present

## 2022-05-08 DIAGNOSIS — I10 Essential (primary) hypertension: Secondary | ICD-10-CM | POA: Diagnosis not present

## 2022-05-08 DIAGNOSIS — E1129 Type 2 diabetes mellitus with other diabetic kidney complication: Secondary | ICD-10-CM | POA: Diagnosis not present

## 2022-05-08 DIAGNOSIS — Z6832 Body mass index (BMI) 32.0-32.9, adult: Secondary | ICD-10-CM | POA: Diagnosis not present

## 2022-05-08 DIAGNOSIS — M159 Polyosteoarthritis, unspecified: Secondary | ICD-10-CM | POA: Diagnosis not present

## 2022-05-08 DIAGNOSIS — R809 Proteinuria, unspecified: Secondary | ICD-10-CM | POA: Diagnosis not present

## 2022-07-10 DIAGNOSIS — I10 Essential (primary) hypertension: Secondary | ICD-10-CM | POA: Diagnosis not present

## 2022-07-10 DIAGNOSIS — K5909 Other constipation: Secondary | ICD-10-CM | POA: Diagnosis not present

## 2022-07-10 DIAGNOSIS — E1129 Type 2 diabetes mellitus with other diabetic kidney complication: Secondary | ICD-10-CM | POA: Diagnosis not present

## 2022-07-10 DIAGNOSIS — E669 Obesity, unspecified: Secondary | ICD-10-CM | POA: Diagnosis not present

## 2022-07-10 DIAGNOSIS — L0390 Cellulitis, unspecified: Secondary | ICD-10-CM | POA: Diagnosis not present

## 2022-07-10 DIAGNOSIS — E785 Hyperlipidemia, unspecified: Secondary | ICD-10-CM | POA: Diagnosis not present

## 2022-07-10 DIAGNOSIS — M159 Polyosteoarthritis, unspecified: Secondary | ICD-10-CM | POA: Diagnosis not present

## 2022-07-10 DIAGNOSIS — Z6832 Body mass index (BMI) 32.0-32.9, adult: Secondary | ICD-10-CM | POA: Diagnosis not present

## 2022-07-10 DIAGNOSIS — R809 Proteinuria, unspecified: Secondary | ICD-10-CM | POA: Diagnosis not present

## 2022-07-12 DIAGNOSIS — B079 Viral wart, unspecified: Secondary | ICD-10-CM | POA: Diagnosis not present

## 2022-07-12 DIAGNOSIS — L600 Ingrowing nail: Secondary | ICD-10-CM | POA: Diagnosis not present

## 2022-07-16 DIAGNOSIS — E11621 Type 2 diabetes mellitus with foot ulcer: Secondary | ICD-10-CM | POA: Diagnosis not present

## 2022-07-16 DIAGNOSIS — Z7984 Long term (current) use of oral hypoglycemic drugs: Secondary | ICD-10-CM | POA: Diagnosis not present

## 2022-07-16 DIAGNOSIS — Z794 Long term (current) use of insulin: Secondary | ICD-10-CM | POA: Diagnosis not present

## 2022-07-16 DIAGNOSIS — L97512 Non-pressure chronic ulcer of other part of right foot with fat layer exposed: Secondary | ICD-10-CM | POA: Diagnosis not present

## 2022-07-16 DIAGNOSIS — E1142 Type 2 diabetes mellitus with diabetic polyneuropathy: Secondary | ICD-10-CM | POA: Diagnosis not present

## 2022-07-23 DIAGNOSIS — C8330 Diffuse large B-cell lymphoma, unspecified site: Secondary | ICD-10-CM | POA: Diagnosis not present

## 2022-07-23 DIAGNOSIS — Z9221 Personal history of antineoplastic chemotherapy: Secondary | ICD-10-CM | POA: Diagnosis not present

## 2022-07-23 DIAGNOSIS — R591 Generalized enlarged lymph nodes: Secondary | ICD-10-CM | POA: Diagnosis not present

## 2022-07-23 DIAGNOSIS — R031 Nonspecific low blood-pressure reading: Secondary | ICD-10-CM | POA: Diagnosis not present

## 2022-07-25 DIAGNOSIS — E1142 Type 2 diabetes mellitus with diabetic polyneuropathy: Secondary | ICD-10-CM | POA: Diagnosis not present

## 2022-07-25 DIAGNOSIS — Z7984 Long term (current) use of oral hypoglycemic drugs: Secondary | ICD-10-CM | POA: Diagnosis not present

## 2022-07-25 DIAGNOSIS — E11621 Type 2 diabetes mellitus with foot ulcer: Secondary | ICD-10-CM | POA: Diagnosis not present

## 2022-07-25 DIAGNOSIS — Z794 Long term (current) use of insulin: Secondary | ICD-10-CM | POA: Diagnosis not present

## 2022-07-25 DIAGNOSIS — L97511 Non-pressure chronic ulcer of other part of right foot limited to breakdown of skin: Secondary | ICD-10-CM | POA: Diagnosis not present

## 2022-07-30 DIAGNOSIS — C8330 Diffuse large B-cell lymphoma, unspecified site: Secondary | ICD-10-CM | POA: Diagnosis not present

## 2022-07-30 DIAGNOSIS — C8339 Diffuse large B-cell lymphoma, extranodal and solid organ sites: Secondary | ICD-10-CM | POA: Diagnosis not present

## 2022-07-30 DIAGNOSIS — C8338 Diffuse large B-cell lymphoma, lymph nodes of multiple sites: Secondary | ICD-10-CM | POA: Diagnosis not present

## 2022-08-06 DIAGNOSIS — L97511 Non-pressure chronic ulcer of other part of right foot limited to breakdown of skin: Secondary | ICD-10-CM | POA: Diagnosis not present

## 2022-08-06 DIAGNOSIS — R809 Proteinuria, unspecified: Secondary | ICD-10-CM | POA: Diagnosis not present

## 2022-08-06 DIAGNOSIS — E785 Hyperlipidemia, unspecified: Secondary | ICD-10-CM | POA: Diagnosis not present

## 2022-08-06 DIAGNOSIS — Z7984 Long term (current) use of oral hypoglycemic drugs: Secondary | ICD-10-CM | POA: Diagnosis not present

## 2022-08-06 DIAGNOSIS — E1129 Type 2 diabetes mellitus with other diabetic kidney complication: Secondary | ICD-10-CM | POA: Diagnosis not present

## 2022-08-06 DIAGNOSIS — E11621 Type 2 diabetes mellitus with foot ulcer: Secondary | ICD-10-CM | POA: Diagnosis not present

## 2022-08-06 DIAGNOSIS — M159 Polyosteoarthritis, unspecified: Secondary | ICD-10-CM | POA: Diagnosis not present

## 2022-08-06 DIAGNOSIS — R5382 Chronic fatigue, unspecified: Secondary | ICD-10-CM | POA: Diagnosis not present

## 2022-08-06 DIAGNOSIS — K5909 Other constipation: Secondary | ICD-10-CM | POA: Diagnosis not present

## 2022-08-06 DIAGNOSIS — Z794 Long term (current) use of insulin: Secondary | ICD-10-CM | POA: Diagnosis not present

## 2022-08-06 DIAGNOSIS — I10 Essential (primary) hypertension: Secondary | ICD-10-CM | POA: Diagnosis not present

## 2022-08-06 DIAGNOSIS — Z6830 Body mass index (BMI) 30.0-30.9, adult: Secondary | ICD-10-CM | POA: Diagnosis not present

## 2022-08-06 DIAGNOSIS — E1142 Type 2 diabetes mellitus with diabetic polyneuropathy: Secondary | ICD-10-CM | POA: Diagnosis not present

## 2022-08-06 DIAGNOSIS — L03031 Cellulitis of right toe: Secondary | ICD-10-CM | POA: Diagnosis not present

## 2022-08-30 DIAGNOSIS — H25813 Combined forms of age-related cataract, bilateral: Secondary | ICD-10-CM | POA: Diagnosis not present

## 2022-08-30 DIAGNOSIS — H40013 Open angle with borderline findings, low risk, bilateral: Secondary | ICD-10-CM | POA: Diagnosis not present

## 2022-08-30 DIAGNOSIS — E113299 Type 2 diabetes mellitus with mild nonproliferative diabetic retinopathy without macular edema, unspecified eye: Secondary | ICD-10-CM | POA: Diagnosis not present

## 2022-09-02 DIAGNOSIS — R809 Proteinuria, unspecified: Secondary | ICD-10-CM | POA: Diagnosis not present

## 2022-09-02 DIAGNOSIS — K5909 Other constipation: Secondary | ICD-10-CM | POA: Diagnosis not present

## 2022-09-02 DIAGNOSIS — M159 Polyosteoarthritis, unspecified: Secondary | ICD-10-CM | POA: Diagnosis not present

## 2022-09-02 DIAGNOSIS — I10 Essential (primary) hypertension: Secondary | ICD-10-CM | POA: Diagnosis not present

## 2022-09-02 DIAGNOSIS — E1129 Type 2 diabetes mellitus with other diabetic kidney complication: Secondary | ICD-10-CM | POA: Diagnosis not present

## 2022-09-02 DIAGNOSIS — Z6830 Body mass index (BMI) 30.0-30.9, adult: Secondary | ICD-10-CM | POA: Diagnosis not present

## 2022-09-02 DIAGNOSIS — E785 Hyperlipidemia, unspecified: Secondary | ICD-10-CM | POA: Diagnosis not present

## 2022-09-02 DIAGNOSIS — L03115 Cellulitis of right lower limb: Secondary | ICD-10-CM | POA: Diagnosis not present

## 2022-09-30 DIAGNOSIS — Z125 Encounter for screening for malignant neoplasm of prostate: Secondary | ICD-10-CM | POA: Diagnosis not present

## 2022-09-30 DIAGNOSIS — I10 Essential (primary) hypertension: Secondary | ICD-10-CM | POA: Diagnosis not present

## 2022-09-30 DIAGNOSIS — K5909 Other constipation: Secondary | ICD-10-CM | POA: Diagnosis not present

## 2022-09-30 DIAGNOSIS — Z1212 Encounter for screening for malignant neoplasm of rectum: Secondary | ICD-10-CM | POA: Diagnosis not present

## 2022-09-30 DIAGNOSIS — Z6830 Body mass index (BMI) 30.0-30.9, adult: Secondary | ICD-10-CM | POA: Diagnosis not present

## 2022-09-30 DIAGNOSIS — R809 Proteinuria, unspecified: Secondary | ICD-10-CM | POA: Diagnosis not present

## 2022-09-30 DIAGNOSIS — Z0000 Encounter for general adult medical examination without abnormal findings: Secondary | ICD-10-CM | POA: Diagnosis not present

## 2022-09-30 DIAGNOSIS — E785 Hyperlipidemia, unspecified: Secondary | ICD-10-CM | POA: Diagnosis not present

## 2022-09-30 DIAGNOSIS — M159 Polyosteoarthritis, unspecified: Secondary | ICD-10-CM | POA: Diagnosis not present

## 2022-09-30 DIAGNOSIS — E1129 Type 2 diabetes mellitus with other diabetic kidney complication: Secondary | ICD-10-CM | POA: Diagnosis not present

## 2022-10-14 DIAGNOSIS — I10 Essential (primary) hypertension: Secondary | ICD-10-CM | POA: Diagnosis not present

## 2022-10-14 DIAGNOSIS — Z6830 Body mass index (BMI) 30.0-30.9, adult: Secondary | ICD-10-CM | POA: Diagnosis not present

## 2022-10-14 DIAGNOSIS — M159 Polyosteoarthritis, unspecified: Secondary | ICD-10-CM | POA: Diagnosis not present

## 2022-10-14 DIAGNOSIS — E1129 Type 2 diabetes mellitus with other diabetic kidney complication: Secondary | ICD-10-CM | POA: Diagnosis not present

## 2022-10-14 DIAGNOSIS — K5909 Other constipation: Secondary | ICD-10-CM | POA: Diagnosis not present

## 2022-10-14 DIAGNOSIS — R809 Proteinuria, unspecified: Secondary | ICD-10-CM | POA: Diagnosis not present

## 2022-10-14 DIAGNOSIS — E785 Hyperlipidemia, unspecified: Secondary | ICD-10-CM | POA: Diagnosis not present

## 2022-10-14 DIAGNOSIS — Z23 Encounter for immunization: Secondary | ICD-10-CM | POA: Diagnosis not present

## 2022-10-15 DIAGNOSIS — Z794 Long term (current) use of insulin: Secondary | ICD-10-CM | POA: Diagnosis not present

## 2022-10-15 DIAGNOSIS — E11621 Type 2 diabetes mellitus with foot ulcer: Secondary | ICD-10-CM | POA: Diagnosis not present

## 2022-10-15 DIAGNOSIS — L97512 Non-pressure chronic ulcer of other part of right foot with fat layer exposed: Secondary | ICD-10-CM | POA: Diagnosis not present

## 2022-10-15 DIAGNOSIS — E1142 Type 2 diabetes mellitus with diabetic polyneuropathy: Secondary | ICD-10-CM | POA: Diagnosis not present

## 2022-10-15 DIAGNOSIS — Z7984 Long term (current) use of oral hypoglycemic drugs: Secondary | ICD-10-CM | POA: Diagnosis not present

## 2022-10-31 DIAGNOSIS — E11621 Type 2 diabetes mellitus with foot ulcer: Secondary | ICD-10-CM | POA: Diagnosis not present

## 2022-10-31 DIAGNOSIS — E1142 Type 2 diabetes mellitus with diabetic polyneuropathy: Secondary | ICD-10-CM | POA: Diagnosis not present

## 2022-10-31 DIAGNOSIS — Z7984 Long term (current) use of oral hypoglycemic drugs: Secondary | ICD-10-CM | POA: Diagnosis not present

## 2022-10-31 DIAGNOSIS — Z794 Long term (current) use of insulin: Secondary | ICD-10-CM | POA: Diagnosis not present

## 2022-10-31 DIAGNOSIS — L97511 Non-pressure chronic ulcer of other part of right foot limited to breakdown of skin: Secondary | ICD-10-CM | POA: Diagnosis not present

## 2022-11-05 DIAGNOSIS — L570 Actinic keratosis: Secondary | ICD-10-CM | POA: Diagnosis not present

## 2022-11-11 DIAGNOSIS — K5909 Other constipation: Secondary | ICD-10-CM | POA: Diagnosis not present

## 2022-11-11 DIAGNOSIS — E785 Hyperlipidemia, unspecified: Secondary | ICD-10-CM | POA: Diagnosis not present

## 2022-11-11 DIAGNOSIS — E1129 Type 2 diabetes mellitus with other diabetic kidney complication: Secondary | ICD-10-CM | POA: Diagnosis not present

## 2022-11-11 DIAGNOSIS — R809 Proteinuria, unspecified: Secondary | ICD-10-CM | POA: Diagnosis not present

## 2022-11-11 DIAGNOSIS — M159 Polyosteoarthritis, unspecified: Secondary | ICD-10-CM | POA: Diagnosis not present

## 2022-11-11 DIAGNOSIS — Z6830 Body mass index (BMI) 30.0-30.9, adult: Secondary | ICD-10-CM | POA: Diagnosis not present

## 2022-11-11 DIAGNOSIS — I10 Essential (primary) hypertension: Secondary | ICD-10-CM | POA: Diagnosis not present

## 2022-11-14 DIAGNOSIS — Z131 Encounter for screening for diabetes mellitus: Secondary | ICD-10-CM | POA: Diagnosis not present

## 2022-11-21 DIAGNOSIS — E11621 Type 2 diabetes mellitus with foot ulcer: Secondary | ICD-10-CM | POA: Diagnosis not present

## 2022-11-21 DIAGNOSIS — Z7984 Long term (current) use of oral hypoglycemic drugs: Secondary | ICD-10-CM | POA: Diagnosis not present

## 2022-11-21 DIAGNOSIS — M216X1 Other acquired deformities of right foot: Secondary | ICD-10-CM | POA: Diagnosis not present

## 2022-11-21 DIAGNOSIS — E1142 Type 2 diabetes mellitus with diabetic polyneuropathy: Secondary | ICD-10-CM | POA: Diagnosis not present

## 2022-11-21 DIAGNOSIS — Z794 Long term (current) use of insulin: Secondary | ICD-10-CM | POA: Diagnosis not present

## 2022-11-21 DIAGNOSIS — L97511 Non-pressure chronic ulcer of other part of right foot limited to breakdown of skin: Secondary | ICD-10-CM | POA: Diagnosis not present

## 2022-12-09 DIAGNOSIS — M159 Polyosteoarthritis, unspecified: Secondary | ICD-10-CM | POA: Diagnosis not present

## 2022-12-09 DIAGNOSIS — I10 Essential (primary) hypertension: Secondary | ICD-10-CM | POA: Diagnosis not present

## 2022-12-09 DIAGNOSIS — R809 Proteinuria, unspecified: Secondary | ICD-10-CM | POA: Diagnosis not present

## 2022-12-09 DIAGNOSIS — K5909 Other constipation: Secondary | ICD-10-CM | POA: Diagnosis not present

## 2022-12-09 DIAGNOSIS — E785 Hyperlipidemia, unspecified: Secondary | ICD-10-CM | POA: Diagnosis not present

## 2022-12-09 DIAGNOSIS — E1129 Type 2 diabetes mellitus with other diabetic kidney complication: Secondary | ICD-10-CM | POA: Diagnosis not present

## 2022-12-09 DIAGNOSIS — Z6830 Body mass index (BMI) 30.0-30.9, adult: Secondary | ICD-10-CM | POA: Diagnosis not present

## 2022-12-12 DIAGNOSIS — M216X1 Other acquired deformities of right foot: Secondary | ICD-10-CM | POA: Diagnosis not present

## 2022-12-12 DIAGNOSIS — L97511 Non-pressure chronic ulcer of other part of right foot limited to breakdown of skin: Secondary | ICD-10-CM | POA: Diagnosis not present

## 2022-12-12 DIAGNOSIS — Z794 Long term (current) use of insulin: Secondary | ICD-10-CM | POA: Diagnosis not present

## 2022-12-12 DIAGNOSIS — E1142 Type 2 diabetes mellitus with diabetic polyneuropathy: Secondary | ICD-10-CM | POA: Diagnosis not present

## 2022-12-12 DIAGNOSIS — E11621 Type 2 diabetes mellitus with foot ulcer: Secondary | ICD-10-CM | POA: Diagnosis not present

## 2022-12-12 DIAGNOSIS — Z7984 Long term (current) use of oral hypoglycemic drugs: Secondary | ICD-10-CM | POA: Diagnosis not present

## 2022-12-31 DIAGNOSIS — E1142 Type 2 diabetes mellitus with diabetic polyneuropathy: Secondary | ICD-10-CM | POA: Diagnosis not present

## 2022-12-31 DIAGNOSIS — M216X1 Other acquired deformities of right foot: Secondary | ICD-10-CM | POA: Diagnosis not present

## 2022-12-31 DIAGNOSIS — Z794 Long term (current) use of insulin: Secondary | ICD-10-CM | POA: Diagnosis not present

## 2022-12-31 DIAGNOSIS — L97511 Non-pressure chronic ulcer of other part of right foot limited to breakdown of skin: Secondary | ICD-10-CM | POA: Diagnosis not present

## 2022-12-31 DIAGNOSIS — Z7984 Long term (current) use of oral hypoglycemic drugs: Secondary | ICD-10-CM | POA: Diagnosis not present

## 2022-12-31 DIAGNOSIS — E11621 Type 2 diabetes mellitus with foot ulcer: Secondary | ICD-10-CM | POA: Diagnosis not present

## 2023-01-06 DIAGNOSIS — Z6831 Body mass index (BMI) 31.0-31.9, adult: Secondary | ICD-10-CM | POA: Diagnosis not present

## 2023-01-06 DIAGNOSIS — I10 Essential (primary) hypertension: Secondary | ICD-10-CM | POA: Diagnosis not present

## 2023-01-06 DIAGNOSIS — E785 Hyperlipidemia, unspecified: Secondary | ICD-10-CM | POA: Diagnosis not present

## 2023-01-06 DIAGNOSIS — M159 Polyosteoarthritis, unspecified: Secondary | ICD-10-CM | POA: Diagnosis not present

## 2023-01-06 DIAGNOSIS — E1129 Type 2 diabetes mellitus with other diabetic kidney complication: Secondary | ICD-10-CM | POA: Diagnosis not present

## 2023-01-06 DIAGNOSIS — K5909 Other constipation: Secondary | ICD-10-CM | POA: Diagnosis not present

## 2023-01-06 DIAGNOSIS — R809 Proteinuria, unspecified: Secondary | ICD-10-CM | POA: Diagnosis not present

## 2023-01-15 DIAGNOSIS — M216X1 Other acquired deformities of right foot: Secondary | ICD-10-CM | POA: Diagnosis not present

## 2023-11-27 ENCOUNTER — Other Ambulatory Visit (HOSPITAL_COMMUNITY): Payer: Self-pay

## 2023-12-15 ENCOUNTER — Ambulatory Visit (HOSPITAL_BASED_OUTPATIENT_CLINIC_OR_DEPARTMENT_OTHER)
Admission: EM | Admit: 2023-12-15 | Discharge: 2023-12-15 | Disposition: A | Discharge status: Home / Self Care | Payer: PPO | Attending: Internal Medicine | Admitting: Internal Medicine

## 2023-12-15 ENCOUNTER — Encounter (HOSPITAL_BASED_OUTPATIENT_CLINIC_OR_DEPARTMENT_OTHER): Payer: Self-pay

## 2023-12-15 DIAGNOSIS — M25461 Effusion, right knee: Secondary | ICD-10-CM | POA: Diagnosis not present

## 2023-12-15 DIAGNOSIS — S86911A Strain of unspecified muscle(s) and tendon(s) at lower leg level, right leg, initial encounter: Secondary | ICD-10-CM

## 2023-12-15 NOTE — ED Provider Notes (Signed)
 Brad Spencer CARE    CSN: [id number] Arrival date & time: 12/15/23  0804      History   Chief Complaint Chief Complaint  Patient presents with   Knee Pain    HPI Brad Spencer is a 73 y.o. male who presents with R knee x 3 days which occurred as he was stepping down his tractor as his foot was pivoting outwards and did not see the rocks under the leaves when he stepped down. He denies hearing or feeling a pop. This area has been swollen and knows he has fluid in it. He would like me to aspirate the fluid since this has helped in the past. Denies fever or redness. Has hard time fully flexing it and extending it due to pain. Pt was seen at [facility] in the past, but he did not want to go there today.     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 Authorizing Provider  acetaminophen  (TYLENOL ) 500 MG tablet Take 1,000 mg by mouth every 6 (six) hours as needed (pain).    [provider]  ezetimibe  (ZETIA ) 10 MG tablet Take 10 mg by mouth daily with supper. 05/19/20   [provider]  insulin  NPH-regular Human (70-30) 100 UNIT/ML injection Inject 40 Units into the skin 2 (two) times daily before a meal.    [provider]  metFORMIN (GLUCOPHAGE) 850 MG tablet Take 850 mg by mouth 2 (two) times daily with a meal. 05/19/20   [provider]    Family History History reviewed. No  pertinent family history.  Social History Social History   Tobacco Use   Smoking status: Never   Smokeless tobacco: Never  Substance Use Topics   Alcohol use: Never   Drug use: Never     Allergies   Chlorhexidine, Ranitidine hcl, Rosiglitazone, and Latex   Review of Systems Review of Systems  As noted in HPI Physical Exam Triage Vital Signs ED Triage Vitals  Encounter Vitals Group     BP 12/15/23 0815 (!) 162/83     Systolic BP Percentile --      Diastolic BP Percentile --      Pulse Rate 12/15/23 0815 62     Resp 12/15/23 0815 20     Temp 12/15/23 0815 (!) 97.5 F (36.4 C)     Temp Source 12/15/23 0815 Oral     SpO2 12/15/23 0815 97 %     Weight 12/15/23 0817 226 lb (102.5 kg)     Height --      Head Circumference --      Peak Flow --      Pain Score 12/15/23 0817 8     Pain Loc --      Pain Education --      Exclude from Growth Chart --    No  data found.  Updated Vital Signs BP (!) 162/83 (BP Location: Right Arm)   Pulse 62   Temp (!) 97.5 F (36.4 C) (Oral)   Resp 20   Wt 226 lb (102.5 kg)   SpO2 97%   BMI 30.65 kg/m   Visual Acuity Right Eye Distance:   Left Eye Distance:   Bilateral Distance:    Right Eye Near:   Left Eye Near:    Bilateral Near:     Physical Exam Vitals and nursing note reviewed.  Constitutional:      General: He is not in acute distress.    Appearance: He is not toxic-appearing.  HENT:     Right Ear: External ear normal.     Left Ear: External ear normal.  Eyes:     General: No scleral icterus.    Conjunctiva/sclera: Conjunctivae normal.  Pulmonary:     Effort: Pulmonary effort is normal.  Musculoskeletal:     Cervical back: Neck supple.     Comments: R KNEE- with moderate effusion, but most of it seems worse on medial suprapatellar region. There is no warmth or redness present.   Skin:    General: Skin is warm and dry.     Findings: No bruising, erythema, lesion or rash.  Neurological:     Mental Status: He  is alert and oriented to person, place, and time.     Gait: Gait normal.  Psychiatric:        Mood and Affect: Mood normal.        Behavior: Behavior normal.        Thought Content: Thought content normal.        Judgment: Judgment normal.      UC Treatments / Results  Labs (all labs ordered are listed, but only abnormal results are displayed) Labs Reviewed - No data to display  EKG   Radiology No results found.  Procedures Join Aspiration/Injection  Date/Time: 12/15/2023 1:56 PM  Performed by: Lindi Carter, PA-C Authorized by: Lindi Carter, PA-C   Consent:    Consent obtained:  Verbal   Risks, benefits, and alternatives were discussed: yes     Risks discussed:  Bleeding, infection, pain and incomplete drainage   Alternatives discussed:  No treatment Universal protocol:    Procedure explained and questions answered to patient or proxy's satisfaction: yes   Location:    Location:  Knee   Knee:  R knee Anesthesia:    Anesthesia method:  Local infiltration   Local anesthetic:  Lidocaine  1% WITH epi Procedure details:    Preparation: Patient was prepped and draped in usual sterile fashion     Needle gauge:  22 G (after using 22 G and went to 18 G and was not able to aspirate any fluid)   Approach:  Anterior (lateral subpatella space)   Aspirate characteristics: nothing.   Steroid injected: no     Specimen collected: no   Post-procedure details:    Dressing:  Adhesive bandage   Procedure completion:  Tolerated  (including critical care time)  Medications Ordered in UC Medications - No data to display  Initial Impression / Assessment and Plan / UC Course  I have reviewed the triage vital signs and the nursing notes.  R knee strain with effusion  Advised to go back to his orthopedist for FU     Final Clinical Impressions(s) / UC Diagnoses   Final diagnoses:  Knee strain, right, initial encounter   Discharge Instructions    None  ED Prescriptions   None    PDMP not reviewed this encounter.   Lindi Carter, PA-C 12/15/23 1400

## 2023-12-15 NOTE — ED Triage Notes (Signed)
 Friday evening. Cutting wood. Stepped off of tractor step (approx 2 feet). Injured right knee while stepping down. Patient reports +swelling.
